# Patient Record
Sex: Female | Born: 1975 | Race: White | Hispanic: No | Marital: Married | Smoking: Current some day smoker
Health system: Southern US, Community
[De-identification: ages and names within clinical notes are randomized; demographics above are authoritative.]

## PROBLEM LIST (undated history)

## (undated) DIAGNOSIS — R42 Dizziness and giddiness: Secondary | ICD-10-CM

## (undated) DIAGNOSIS — R0789 Other chest pain: Secondary | ICD-10-CM

## (undated) DIAGNOSIS — R51 Headache: Secondary | ICD-10-CM

## (undated) DIAGNOSIS — R519 Headache, unspecified: Secondary | ICD-10-CM

## (undated) DIAGNOSIS — G8929 Other chronic pain: Secondary | ICD-10-CM

## (undated) DIAGNOSIS — F419 Anxiety disorder, unspecified: Secondary | ICD-10-CM

## (undated) HISTORY — PX: WISDOM TOOTH EXTRACTION: SHX21

## (undated) HISTORY — DX: Headache: R51

## (undated) HISTORY — PX: KNEE ARTHROSCOPY: SUR90

## (undated) HISTORY — DX: Headache, unspecified: R51.9

---

## 1998-02-20 ENCOUNTER — Emergency Department (HOSPITAL_COMMUNITY): Admission: EM | Admit: 1998-02-20 | Discharge: 1998-02-20 | Payer: Self-pay | Admitting: Emergency Medicine

## 2002-02-14 ENCOUNTER — Emergency Department (HOSPITAL_COMMUNITY): Admission: EM | Admit: 2002-02-14 | Discharge: 2002-02-14 | Payer: Self-pay | Admitting: Emergency Medicine

## 2002-05-12 ENCOUNTER — Emergency Department (HOSPITAL_COMMUNITY): Admission: EM | Admit: 2002-05-12 | Discharge: 2002-05-12 | Payer: Self-pay | Admitting: *Deleted

## 2011-02-02 ENCOUNTER — Encounter: Payer: Self-pay | Admitting: Emergency Medicine

## 2011-02-02 ENCOUNTER — Emergency Department (HOSPITAL_COMMUNITY)
Admission: EM | Admit: 2011-02-02 | Discharge: 2011-02-02 | Disposition: A | Discharge status: Home / Self Care | Payer: PRIVATE HEALTH INSURANCE | Attending: Emergency Medicine | Admitting: Emergency Medicine

## 2011-02-02 DIAGNOSIS — M436 Torticollis: Secondary | ICD-10-CM | POA: Insufficient documentation

## 2011-02-02 DIAGNOSIS — M62838 Other muscle spasm: Secondary | ICD-10-CM | POA: Insufficient documentation

## 2011-02-02 HISTORY — DX: Anxiety disorder, unspecified: F41.9

## 2011-02-02 MED ORDER — CYCLOBENZAPRINE HCL 5 MG PO TABS: 5.0000 mg | ORAL_TABLET | Freq: Three times a day (TID) | ORAL | Status: AC | PRN

## 2011-02-02 MED ORDER — IBUPROFEN 600 MG PO TABS: 600.0000 mg | ORAL_TABLET | Freq: Four times a day (QID) | ORAL | Status: AC | PRN

## 2011-02-02 MED ORDER — DIAZEPAM 5 MG PO TABS
5.0000 mg | ORAL_TABLET | Freq: Once | ORAL | Status: AC
  Administered 2011-02-02: 5 mg via ORAL
  Filled 2011-02-02: qty 1

## 2011-02-02 MED ORDER — HYDROCODONE-ACETAMINOPHEN 5-325 MG PO TABS
1.0000 | ORAL_TABLET | Freq: Once | ORAL | Status: AC
  Administered 2011-02-02: 1 via ORAL
  Filled 2011-02-02: qty 1

## 2011-02-02 MED ORDER — OXYCODONE-ACETAMINOPHEN 5-325 MG PO TABS: 1.0000 | ORAL_TABLET | ORAL | Status: AC | PRN

## 2011-02-02 MED ORDER — PREDNISONE 20 MG PO TABS
40.0000 mg | ORAL_TABLET | Freq: Once | ORAL | Status: AC
  Administered 2011-02-02: 40 mg via ORAL
  Filled 2011-02-02: qty 2

## 2011-02-02 NOTE — ED Provider Notes (Signed)
History     CSN: [id number] Arrival date & time: 02/02/2011  3:06 PM  Chief Complaint  Patient presents with  . Neck Pain  . Shoulder Pain    HPI Mariah [name] is a 35 y.o. female who presents to the ED for right side neck pain that started yesterday. The patient works 3rd shift and got home yesterday and went to sleep and woke with the pain. Last night at work pain got worse and has continued today. Pain increases with range of motion and any pressure on the area. She denies headache, fever or other symptoms.   Past Medical History  Diagnosis Date  . Anxiety     Past Surgical History  Procedure Date  . Knee arthroscopy     No family history on file.  History  Substance Use Topics  . Smoking status: Not on file  . Smokeless tobacco: Not on file  . Alcohol Use:     OB History    Grav Para Term Preterm Abortions TAB SAB Ect Mult Living                  Review of Systems  HENT: Positive for neck pain.   Psychiatric/Behavioral: The patient is nervous/anxious.   All other systems reviewed and are negative.    Allergies  Penicillins  Home Medications   Current Outpatient Rx  Name Route Sig Dispense Refill  . CITALOPRAM HYDROBROMIDE 20 MG PO TABS Oral Take 20 mg by mouth daily.      Marland Kitchen HEAT THERAPY PATCHES MISC Topical Apply 1 each topically as needed. For muscle pain     . MOMETASONE FUROATE 50 MCG/ACT NA SUSP Nasal Place 2 sprays into the nose daily.        BP 114/77  Pulse 62  Temp 98.6 F (37 C)  Resp 16  Ht 5' 8.5" (1.74 m)  Wt 123 lb (55.792 kg)  BMI 18.43 kg/m2  SpO2 100%  LMP 01/30/2011  Physical Exam  Nursing note and vitals reviewed. Constitutional: She is oriented to person, place, and time. She appears well-developed and well-nourished. No distress.       Uncomfortable appearing. Holding neck tilted to right.  HENT:  Head: Normocephalic and atraumatic.  Eyes: EOM are normal.  Neck: Neck supple.       Decreased range of motion    Cardiovascular: Normal rate.   Pulmonary/Chest: Effort normal.  Abdominal: Soft. There is no tenderness.  Musculoskeletal:       Muscle spasm right side of neck.  Neurological: She is alert and oriented to person, place, and time. She has normal strength. No cranial nerve deficit or sensory deficit.  Skin: Skin is warm and dry.    ED Course  Procedures  MDM  Patient feeling better after medication and heat to neck.  Assessment: Torticollis  Plan:  Flexeril 5 mg. Po tid x 5 days   Percocet 5/325 mg. Po every 6 hours prn   Ibuprofen 600 mg. Po every 6 hours prn   Heat and rest.   Return as needed.   [place], NP 02/02/11 1617  [place], [place] 02/03/11 [phone]

## 2011-02-02 NOTE — ED Notes (Signed)
Pt states woke up yesterday with right side neck pain and shoulder pain. Pt state sfell one week ago but didn't start hurting until today

## 2011-02-02 NOTE — ED Notes (Signed)
Pt states is unsure why she is having neck/shoulder pain.  Thought she slept " wrong" however the pain has worsened over past 24 hrs.  States pain in neck is like a spasm pain increases when moving neck to the left.

## 2011-02-12 NOTE — ED Provider Notes (Signed)
Medical screening examination/treatment/procedure(s) were performed by non-physician practitioner and as supervising physician I was immediately available for consultation/collaboration.   Shelda Jakes, MD 02/12/11 [phone]

## 2012-05-04 ENCOUNTER — Emergency Department (HOSPITAL_COMMUNITY): Payer: Self-pay

## 2012-05-04 ENCOUNTER — Encounter (HOSPITAL_COMMUNITY): Payer: Self-pay

## 2012-05-04 ENCOUNTER — Emergency Department (HOSPITAL_COMMUNITY)
Admission: EM | Admit: 2012-05-04 | Discharge: 2012-05-04 | Disposition: A | Discharge status: Home / Self Care | Payer: Self-pay | Attending: Emergency Medicine | Admitting: Emergency Medicine

## 2012-05-04 DIAGNOSIS — R42 Dizziness and giddiness: Secondary | ICD-10-CM

## 2012-05-04 DIAGNOSIS — H81399 Other peripheral vertigo, unspecified ear: Secondary | ICD-10-CM | POA: Insufficient documentation

## 2012-05-04 DIAGNOSIS — Z8659 Personal history of other mental and behavioral disorders: Secondary | ICD-10-CM | POA: Insufficient documentation

## 2012-05-04 DIAGNOSIS — R110 Nausea: Secondary | ICD-10-CM | POA: Insufficient documentation

## 2012-05-04 DIAGNOSIS — R5381 Other malaise: Secondary | ICD-10-CM | POA: Insufficient documentation

## 2012-05-04 DIAGNOSIS — F17200 Nicotine dependence, unspecified, uncomplicated: Secondary | ICD-10-CM | POA: Insufficient documentation

## 2012-05-04 DIAGNOSIS — R5383 Other fatigue: Secondary | ICD-10-CM | POA: Insufficient documentation

## 2012-05-04 LAB — COMPREHENSIVE METABOLIC PANEL
ALT: 11 U/L (ref 0–35)
AST: 18 U/L (ref 0–37)
Albumin: 4.3 g/dL (ref 3.5–5.2)
Alkaline Phosphatase: 45 U/L (ref 39–117)
CO2: 28 mEq/L (ref 19–32)
Chloride: 103 mEq/L (ref 96–112)
GFR calc non Af Amer: >90 mL/min (ref >90)
Potassium: 4 mEq/L (ref 3.5–5.1)
Sodium: 135 mEq/L (ref 135–145)
Total Bilirubin: 0.5 mg/dL (ref 0.3–1.2)

## 2012-05-04 LAB — CBC WITH DIFFERENTIAL/PLATELET
Basophils Absolute: 0.1 10*3/uL (ref 0.0–0.1)
Basophils Relative: 1 % (ref 0–1)
HCT: 38.7 % (ref 36.0–46.0)
Lymphocytes Relative: 14 % (ref 12–46)
MCHC: 33.3 g/dL (ref 30.0–36.0)
Monocytes Absolute: 0.6 10*3/uL (ref 0.1–1.0)
Neutro Abs: 5.8 10*3/uL (ref 1.7–7.7)
Neutrophils Relative %: 75 % (ref 43–77)
Platelets: 208 10*3/uL (ref 150–400)
RDW: 12.4 % (ref 11.5–15.5)
WBC: 7.7 10*3/uL (ref 4.0–10.5)

## 2012-05-04 LAB — URINALYSIS, ROUTINE W REFLEX MICROSCOPIC
Glucose, UA: NEGATIVE mg/dL
Leukocytes, UA: NEGATIVE
Nitrite: NEGATIVE
Protein, ur: NEGATIVE mg/dL

## 2012-05-04 MED ORDER — ONDANSETRON HCL 4 MG/2ML IJ SOLN
4.0000 mg | Freq: Once | INTRAMUSCULAR | Status: AC
  Administered 2012-05-04: 4 mg via INTRAVENOUS
  Filled 2012-05-04: qty 2

## 2012-05-04 MED ORDER — MECLIZINE HCL 25 MG PO TABS: ORAL_TABLET | ORAL | Status: DC

## 2012-05-04 MED ORDER — PROMETHAZINE HCL 25 MG PO TABS: ORAL_TABLET | ORAL | Status: DC

## 2012-05-04 MED ORDER — MECLIZINE HCL 12.5 MG PO TABS
25.0000 mg | ORAL_TABLET | Freq: Once | ORAL | Status: AC
Start: 2012-05-04 — End: 2012-05-04
  Administered 2012-05-04: 25 mg via ORAL
  Filled 2012-05-04: qty 2

## 2012-05-04 MED ORDER — SODIUM CHLORIDE 0.9 % IV BOLUS (SEPSIS)
1000.0000 mL | Freq: Once | INTRAVENOUS | Status: AC
  Administered 2012-05-04: 1000 mL via INTRAVENOUS

## 2012-05-04 NOTE — ED Provider Notes (Signed)
History    Scribed for Mariah Lennert, MD, the patient was seen in room APA14/APA14. This chart was scribed by Katha Cabal.  CSN: [id number]  Arrival date & time 05/04/12  1248   First MD Initiated Contact with Patient 05/04/12 1407      No chief complaint on file.   (Consider location/radiation/quality/duration/timing/severity/associated sxs/prior treatment) Patient is a 37 y.o. female presenting with weakness. The history is provided by the patient (the pt complains of dizziness with movement). No language interpreter was used.  Weakness Primary symptoms do not include seizures. The symptoms began less than 1 hour ago. The symptoms are unchanged. The neurological symptoms are multifocal. Context: nothing.  Additional symptoms include weakness.   Mariah Lennert, MD entered patient's room at 2:13 PM   Mariah [name] is a 37 y.o. female who presents to the Emergency Department complaining of persistence of constant dizziness that began around 3 AM.  Dizziness with associated nausea.  Dizziness is worse with walking.  Symptoms are not associated with cough, fever, rhinorrhea or abdominal pain.  Reports itchy throat for past couple days and took Alka Seltzer cold medication.      PCP No primary provider on file.      Past Medical History  Diagnosis Date  . Anxiety     Past Surgical History  Procedure Date  . Knee arthroscopy     No family history on file.  History  Substance Use Topics  . Smoking status: Current Every Day Smoker  . Smokeless tobacco: Not on file  . Alcohol Use: Yes     Comment: occ    OB History    Grav Para Term Preterm Abortions TAB SAB Ect Mult Living                  Review of Systems  Neurological: Positive for weakness. Negative for seizures.  All other systems reviewed and are negative.  Remaining review of systems negative except as noted in the HPI.    Allergies  Penicillins  Home Medications   No current outpatient  prescriptions on file.  BP 122/74  Pulse 81  Temp 97.4 F (36.3 C) (Oral)  Resp 20  Ht 5\' 8"  (1.727 m)  Wt 125 lb (56.7 kg)  BMI 19.01 kg/m2  SpO2 99%  LMP 04/20/2012  Physical Exam  Constitutional: She is oriented to person, place, and time. She appears well-developed.  HENT:  Head: Normocephalic and atraumatic.  Right Ear: Tympanic membrane normal.  Left Ear: Tympanic membrane normal.  Eyes: Conjunctivae normal and EOM are normal. No scleral icterus.  Neck: Neck supple. No thyromegaly present.  Cardiovascular: Normal rate and regular rhythm.  Exam reveals no gallop and no friction rub.   No murmur heard. Pulmonary/Chest: No stridor. She has no wheezes. She has no rales. She exhibits no tenderness.  Abdominal: She exhibits no distension. There is no tenderness. There is no rebound.  Musculoskeletal: Normal range of motion. She exhibits no edema.  Lymphadenopathy:    She has no cervical adenopathy.  Neurological: She is oriented to person, place, and time. Coordination normal.  Skin: No rash noted. No erythema.  Psychiatric: She has a normal mood and affect. Her behavior is normal.    ED Course  Procedures (including critical care time)    DIAGNOSTIC STUDIES: Oxygen Saturation is 99% on room air normal by my interpretation.     COORDINATION OF CARE:  2:16 PM  Physical exam complete.  Will CT head and  order vertigo meds.    2:30 PM Antivert, Zofran and IV fluids ordered.   3:54 PM Patient reports some improvement of nausea and dizziness with treatment.  Patient last vomited an hour ago.   Discussed laboratory findings with patient.  Plan to discharge patient with vertigo meds and antiemetic.  Advised patient to increase fluid intake and get plenty of rest.  Will provide patient with work note.   Patient agrees with plan.      LABS / RADIOLOGY:   Labs Reviewed  URINALYSIS, ROUTINE W REFLEX MICROSCOPIC - Abnormal; Notable for the following:    APPearance CLOUDY (*)      pH 8.5 (*)     All other components within normal limits  CBC WITH DIFFERENTIAL  COMPREHENSIVE METABOLIC PANEL  POCT PREGNANCY, URINE   Ct Head Wo Contrast  05/04/2012  *RADIOLOGY REPORT*  Clinical Data: Dizziness  CT HEAD WITHOUT CONTRAST  Technique:  Contiguous axial images were obtained from the base of the skull through the vertex without contrast.  Comparison: 07/13/2010  Findings: The brain has a normal appearance without evidence for hemorrhage, infarction, hydrocephalus, or mass lesion.  There is no extra axial fluid collection.  The skull and paranasal sinuses are normal.  IMPRESSION:  1.  No acute intracranial abnormalities.   Original Report Authenticated By: Signa Kell, M.D.          IMPRESSION: 1. Vertigo      NEW MEDICATIONS: New Prescriptions   No medications on file            MDM          The chart was scribed for me under my direct supervision.  I personally performed the history, physical, and medical decision making and all procedures in the evaluation of this patient.Mariah Lennert, MD 05/04/12 [phone]

## 2012-05-04 NOTE — ED Notes (Signed)
Instructed pt to notify staff when she can provide urine sample.

## 2012-05-04 NOTE — ED Notes (Signed)
Patient transported to CT 

## 2012-05-04 NOTE — ED Notes (Signed)
Pt reports for past 2 or 3 days has had scratchy cough and headache.  Has been taking alkaseltzer cold medicine.  Last dose was 1230 last night.  Reports woke up around 0300 with dizziness, n/v.  Pt says dizziness is much worse with movement and makes her vomit.  Denies any pain.

## 2012-09-01 ENCOUNTER — Encounter (HOSPITAL_COMMUNITY): Payer: Self-pay

## 2012-09-01 ENCOUNTER — Emergency Department (HOSPITAL_COMMUNITY)
Admission: EM | Admit: 2012-09-01 | Discharge: 2012-09-01 | Disposition: A | Discharge status: Home / Self Care | Payer: Self-pay | Attending: Emergency Medicine | Admitting: Emergency Medicine

## 2012-09-01 ENCOUNTER — Emergency Department (HOSPITAL_COMMUNITY): Payer: Self-pay

## 2012-09-01 DIAGNOSIS — Y9289 Other specified places as the place of occurrence of the external cause: Secondary | ICD-10-CM | POA: Insufficient documentation

## 2012-09-01 DIAGNOSIS — Z9889 Other specified postprocedural states: Secondary | ICD-10-CM | POA: Insufficient documentation

## 2012-09-01 DIAGNOSIS — S86911A Strain of unspecified muscle(s) and tendon(s) at lower leg level, right leg, initial encounter: Secondary | ICD-10-CM

## 2012-09-01 DIAGNOSIS — F17200 Nicotine dependence, unspecified, uncomplicated: Secondary | ICD-10-CM | POA: Insufficient documentation

## 2012-09-01 DIAGNOSIS — Y990 Civilian activity done for income or pay: Secondary | ICD-10-CM | POA: Insufficient documentation

## 2012-09-01 DIAGNOSIS — Z880 Allergy status to penicillin: Secondary | ICD-10-CM | POA: Insufficient documentation

## 2012-09-01 DIAGNOSIS — IMO0002 Reserved for concepts with insufficient information to code with codable children: Secondary | ICD-10-CM | POA: Insufficient documentation

## 2012-09-01 DIAGNOSIS — Y9301 Activity, walking, marching and hiking: Secondary | ICD-10-CM | POA: Insufficient documentation

## 2012-09-01 DIAGNOSIS — X503XXA Overexertion from repetitive movements, initial encounter: Secondary | ICD-10-CM | POA: Insufficient documentation

## 2012-09-01 DIAGNOSIS — Z8659 Personal history of other mental and behavioral disorders: Secondary | ICD-10-CM | POA: Insufficient documentation

## 2012-09-01 MED ORDER — HYDROCODONE-ACETAMINOPHEN 5-325 MG PO TABS: 2.0000 | ORAL_TABLET | Freq: Four times a day (QID) | ORAL | Status: DC | PRN

## 2012-09-01 NOTE — ED Notes (Signed)
Onset of pain right shin after being on feet 12 hours, no known injury

## 2012-09-01 NOTE — ED Provider Notes (Signed)
History  This chart was scribed for Mariah Lyons, MD by Shari Heritage, ED Scribe. The patient was seen in room APA17/APA17. Patient's care was started at 0703.   CSN: [id number]  Arrival date & time 09/01/12  4098   First MD Initiated Contact with Patient 09/01/12 0703      Chief Complaint  Patient presents with  . Leg Pain    The history is provided by the patient. No language interpreter was used.    HPI Comments: Mariah [name] is a 37 y.o. female who presents to the Emergency Department complaining of gradual, worsening, moderate right anterior lower leg pain onset yesterday night. She states that pain began after arriving at work last night where she is required to stand for long periods of time and do a lot of walking. Pain is worse with walking and flexion of the right foot. She denies any obvious injury or trauma to the area. She denies any right calf pain. She has no history of the same. There is no numbness, weakness, tingling, nausea, vomiting, fever or chills. Patient has a medical history of anxiety. She is a current every day smoker and uses alcohol occasionally.   Past Medical History  Diagnosis Date  . Anxiety     Past Surgical History  Procedure Laterality Date  . Knee arthroscopy      History reviewed. No pertinent family history.  History  Substance Use Topics  . Smoking status: Current Every Day Smoker  . Smokeless tobacco: Not on file  . Alcohol Use: Yes     Comment: occ    OB History   Grav Para Term Preterm Abortions TAB SAB Ect Mult Living                  Review of Systems  Constitutional: Negative for fever and chills.  Gastrointestinal: Negative for nausea and vomiting.  Neurological: Negative for weakness and numbness.    Allergies  Penicillins  Home Medications   Current Outpatient Rx  Name  Route  Sig  Dispense  Refill  . meclizine (ANTIVERT) 25 MG tablet      Take one every 4-6 hours for dizziness   28 tablet   0   .  promethazine (PHENERGAN) 25 MG tablet      Take one every 4-6 hours for nauseau   20 tablet   0     Triage Vitals: BP 115/71  Pulse 66  Temp(Src) 98.2 F (36.8 C) (Oral)  Resp 16  SpO2 100%  LMP 07/31/2012  Physical Exam  Constitutional: She is oriented to person, place, and time. She appears well-developed and well-nourished.  HENT:  Head: Normocephalic and atraumatic.  Eyes: Conjunctivae and EOM are normal. Pupils are equal, round, and reactive to light.  Neck: Normal range of motion. Neck supple.  Pulmonary/Chest: No respiratory distress.  Musculoskeletal: Normal range of motion.  Right leg appears grossly normal. Tenderness to palpation on the lower anterior right shin area. DP and PT pulses are easily palpable. Pain in the anterior leg with dorsiflexion. Homans sign is absent.  Neurological: She is alert and oriented to person, place, and time.  Skin: Skin is warm and dry.  Psychiatric: She has a normal mood and affect. Her behavior is normal.    ED Course  Procedures (including critical care time) DIAGNOSTIC STUDIES: Oxygen Saturation is 100% on room air, normal by my interpretation.    COORDINATION OF CARE: 7:22 AM- Patient informed of current plan for treatment and  evaluation and agrees with plan at this time.      Labs Reviewed - No data to display No results found.   No diagnosis found.    MDM  The xrays are unremarkable.  This appears to be a strain of the tendons of the anterior tibial area.  There is no calf pain, tenderness and I have no suspicion for DVT.  Will treat with nsaids, pain meds.  Follow up prn.      I personally performed the services described in this documentation, which was scribed in my presence. The recorded information has been reviewed and is accurate.      Mariah Lyons, MD 09/01/12 [phone]

## 2014-03-23 ENCOUNTER — Encounter (HOSPITAL_COMMUNITY): Payer: Self-pay | Admitting: *Deleted

## 2014-03-23 ENCOUNTER — Emergency Department (HOSPITAL_COMMUNITY)
Admission: EM | Admit: 2014-03-23 | Discharge: 2014-03-23 | Disposition: A | Discharge status: Home / Self Care | Payer: PRIVATE HEALTH INSURANCE | Attending: Emergency Medicine | Admitting: Emergency Medicine

## 2014-03-23 DIAGNOSIS — Z8659 Personal history of other mental and behavioral disorders: Secondary | ICD-10-CM | POA: Insufficient documentation

## 2014-03-23 DIAGNOSIS — Z79899 Other long term (current) drug therapy: Secondary | ICD-10-CM | POA: Insufficient documentation

## 2014-03-23 DIAGNOSIS — M940 Chondrocostal junction syndrome [Tietze]: Secondary | ICD-10-CM

## 2014-03-23 MED ORDER — HYDROCODONE-ACETAMINOPHEN 5-325 MG PO TABS: ORAL_TABLET | ORAL | Status: DC

## 2014-03-23 MED ORDER — NAPROXEN 500 MG PO TABS: 500.0000 mg | ORAL_TABLET | Freq: Two times a day (BID) | ORAL | Status: DC

## 2014-03-23 NOTE — ED Provider Notes (Signed)
CSN: [id number]     Arrival date & time 03/23/14  1810 History  This chart was scribed for Mariah Ausammy Farha Dano, PA-C with Gerhard Munchobert Lockwood, MD by Tonye RoyaltyJoshua Chen, ED Scribe. This patient was seen in room APFT20/APFT20 and the patient's care was started at 7:55 PM.    Chief Complaint  Patient presents with  . Muscle Pain   The history is provided by the patient. No language interpreter was used.    HPI Comments: Mariah [name] is a 38 y.o. female who presents to the Emergency Department complaining of right-sided chest pain with gradual onset since 2 days ago; she states she believes she pulled a muscle. 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 She notes that felt a "pop" on waking and rolling over 4 days ago, but did not have pain until 2 days ago. She states pain is worse with ROM of her right arm, when she sits straight up, and once with deep breath this morning. She states it is painful to lay on either side. She states she has used a heating pad, Tylenol, and Motrin. She denies tenderness to the touch and states it feels deeper than her breast. She reports recent benign breast exam. She denies prior rib fractures. She denies shortness of breath, numbness/tingling in her hands, dizziness, nausea, neck pain or diaphoresis. She denies any cracking or popping sensations with breathing or movement.  Past Medical History  Diagnosis Date  . Anxiety    Past Surgical History  Procedure Laterality Date  . Knee arthroscopy     No family history on file. History  Substance Use Topics  . Smoking status: Current Every Day Smoker  . Smokeless tobacco: Not on file  . Alcohol Use: Yes     Comment: occ   OB History    No data available     Review of Systems  Constitutional: Negative for diaphoresis.  Respiratory: Negative for cough, chest tightness and shortness of breath.   Cardiovascular: Positive for chest pain. Negative for  palpitations.  Gastrointestinal: Negative for nausea.  Musculoskeletal: Negative for neck pain and neck stiffness.  Skin: Negative for rash and wound.  Neurological: Negative for dizziness, weakness, numbness and headaches.  All other systems reviewed and are negative.     Allergies  Penicillins  Home Medications   Prior to Admission medications   Medication Sig Start Date End Date Taking? Authorizing Provider  cetirizine (ZYRTEC) 10 MG tablet Take 10 mg by mouth daily.   Yes Historical Provider, MD  meclizine (ANTIVERT) 25 MG tablet Take one every 4-6 hours for dizziness 05/04/12  Yes Benny LennertJoseph L Zammit, MD  Multiple Vitamin (MULTIVITAMIN WITH MINERALS) TABS tablet Take 1 tablet by mouth daily.   Yes Historical Provider, MD  vitamin C (ASCORBIC ACID) 500 MG tablet Take 500 mg by mouth daily.   Yes Historical Provider, MD  HYDROcodone-acetaminophen (NORCO/VICODIN) 5-325 MG per tablet Take 2 tablets by mouth every 6 (six) hours as needed for pain. Patient not taking: Reported on 03/23/2014 09/01/12   Geoffery Lyonsouglas Delo, MD  promethazine (PHENERGAN) 25 MG tablet Take one every 4-6 hours for nauseau Patient not taking: Reported on 03/23/2014 05/04/12   Benny LennertJoseph L Zammit, MD   BP 119/69 mmHg  Pulse 68  Temp(Src) 98.1 F (36.7 C) (Oral)  Resp 16  Ht 5\' 9"  (1.753 m)  Wt 145 lb (65.772 kg)  BMI 21.40 kg/m2  SpO2  100%  LMP 03/11/2014 Physical Exam  Constitutional: She is oriented to person, place, and time. She appears well-developed and well-nourished.  HENT:  Head: Normocephalic and atraumatic.  Eyes: Conjunctivae are normal.  Neck: Normal range of motion, full passive range of motion without pain and phonation normal. Neck supple. No Kernig's sign noted.  Cardiovascular: Normal rate, regular rhythm, normal heart sounds and intact distal pulses.   No murmur heard. Pulmonary/Chest: Effort normal and breath sounds normal. No respiratory distress. She has no wheezes. She has no rales.  Tenderness: localized tendernes to right substernal area, pain is reproduceable with right arm movement, no edema or crepitus.  Musculoskeletal: Normal range of motion. She exhibits no edema.  Neurological: She is alert and oriented to person, place, and time. She exhibits normal muscle tone. Coordination normal.  Skin: Skin is warm and dry.  Psychiatric: She has a normal mood and affect.  Nursing note and vitals reviewed.   ED Course  Procedures (including critical care time)  DIAGNOSTIC STUDIES: Oxygen Saturation is 100% on room air, normal by my interpretation.    COORDINATION OF CARE: 8:02 PM Discussed treatment plan with patient at beside, the patient agrees with the plan and has no further questions at this time.   Labs Review Labs Reviewed - No data to display  Imaging Review No results found.   EKG Interpretation None      MDM   Final diagnoses:  Costochondritis    VSS.  Pain to right mid chest is reproduced with right arm movement and position change.  No tachycardia, tachypnea or hypoxia.  Patient declined to have XR's at this time.  No concerning sx's for PE or cardiac process at this time. She agrees to PMD f/u or to return here if sx's worsen.  rx for vicodin and naprosyn, agrees to heat and ice  I personally performed the services described in this documentation, which was scribed in my presence. The recorded information has been reviewed and is accurate.    Shreyansh Tiffany L. Trisha Mangleriplett, PA-C 03/25/14 1309  Gerhard Munchobert Lockwood, MD 03/25/14 [phone]

## 2014-03-23 NOTE — ED Notes (Signed)
Pain rt chest at sternal border, No known injury, Movement of arm causes pain at times.

## 2014-03-23 NOTE — ED Notes (Signed)
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 Pain is worse with movement.

## 2014-03-23 NOTE — Discharge Instructions (Signed)
Costochondritis °Costochondritis is a condition in which the tissue (cartilage) that connects your ribs with your breastbone (sternum) becomes irritated. It causes pain in the chest and rib area. It usually goes away on its own over time. °HOME CARE °· Avoid activities that wear you out. °· Do not strain your ribs. Avoid activities that use your: °¨ Chest. °¨ Belly. °¨ Side muscles. °· Put ice on the area for the first 2 days after the pain starts. °¨ Put ice in a plastic bag. °¨ Place a towel between your skin and the bag. °¨ Leave the ice on for 20 minutes, 2-3 times a day. °· Only take medicine as told by your doctor. °GET HELP IF: °· You have redness or puffiness (swelling) in the rib area. °· Your pain does not go away with rest or medicine. °GET HELP RIGHT AWAY IF:  °· Your pain gets worse. °· You are very uncomfortable. °· You have trouble breathing. °· You cough up blood. °· You start sweating or throwing up (vomiting). °· You have a fever or lasting symptoms for more than 2-3 days. °· You have a fever and your symptoms suddenly get worse. °MAKE SURE YOU:  °· Understand these instructions. °· Will watch your condition. °· Will get help right away if you are not doing well or get worse. °Document Released: 09/27/2007 Document Revised: 12/11/2012 Document Reviewed: 11/12/2012 °[employer]® Patient Information ©2015 [employer]. This information is not intended to replace advice given to you by your health care provider. Make sure you discuss any questions you have with your health care provider. ° °

## 2014-06-10 ENCOUNTER — Encounter (HOSPITAL_COMMUNITY): Payer: Self-pay | Admitting: Emergency Medicine

## 2014-06-10 ENCOUNTER — Emergency Department (HOSPITAL_COMMUNITY)
Admission: EM | Admit: 2014-06-10 | Discharge: 2014-06-10 | Disposition: A | Discharge status: Home / Self Care | Payer: PRIVATE HEALTH INSURANCE | Attending: Emergency Medicine | Admitting: Emergency Medicine

## 2014-06-10 ENCOUNTER — Emergency Department (HOSPITAL_COMMUNITY): Payer: PRIVATE HEALTH INSURANCE

## 2014-06-10 DIAGNOSIS — Z880 Allergy status to penicillin: Secondary | ICD-10-CM | POA: Insufficient documentation

## 2014-06-10 DIAGNOSIS — Z791 Long term (current) use of non-steroidal anti-inflammatories (NSAID): Secondary | ICD-10-CM | POA: Insufficient documentation

## 2014-06-10 DIAGNOSIS — G8929 Other chronic pain: Secondary | ICD-10-CM | POA: Diagnosis not present

## 2014-06-10 DIAGNOSIS — R0789 Other chest pain: Secondary | ICD-10-CM | POA: Insufficient documentation

## 2014-06-10 DIAGNOSIS — R079 Chest pain, unspecified: Secondary | ICD-10-CM | POA: Diagnosis present

## 2014-06-10 DIAGNOSIS — Z79899 Other long term (current) drug therapy: Secondary | ICD-10-CM | POA: Diagnosis not present

## 2014-06-10 DIAGNOSIS — Z87891 Personal history of nicotine dependence: Secondary | ICD-10-CM | POA: Diagnosis not present

## 2014-06-10 DIAGNOSIS — Z8659 Personal history of other mental and behavioral disorders: Secondary | ICD-10-CM | POA: Diagnosis not present

## 2014-06-10 HISTORY — DX: Other chronic pain: G89.29

## 2014-06-10 HISTORY — DX: Dizziness and giddiness: R42

## 2014-06-10 HISTORY — DX: Other chest pain: R07.89

## 2014-06-10 LAB — BASIC METABOLIC PANEL
ANION GAP: 4 — AB (ref 5–15)
BUN: 13 mg/dL (ref 6–23)
CHLORIDE: 109 mmol/L (ref 96–112)
CO2: 27 mmol/L (ref 19–32)
Calcium: 9.3 mg/dL (ref 8.4–10.5)
Creatinine, Ser: 0.84 mg/dL (ref 0.50–1.10)
GFR calc non Af Amer: 86 mL/min — ABNORMAL LOW (ref >90)
Glucose, Bld: 82 mg/dL (ref 70–99)
POTASSIUM: 4 mmol/L (ref 3.5–5.1)
Sodium: 140 mmol/L (ref 135–145)

## 2014-06-10 LAB — CBC
HEMATOCRIT: 36.1 % (ref 36.0–46.0)
HEMOGLOBIN: 12.1 g/dL (ref 12.0–15.0)
MCH: 30.4 pg (ref 26.0–34.0)
MCHC: 33.5 g/dL (ref 30.0–36.0)
MCV: 90.7 fL (ref 78.0–100.0)
Platelets: 243 10*3/uL (ref 150–400)
RBC: 3.98 MIL/uL (ref 3.87–5.11)
RDW: 12.8 % (ref 11.5–15.5)
WBC: 5.8 10*3/uL (ref 4.0–10.5)

## 2014-06-10 LAB — TROPONIN I

## 2014-06-10 MED ORDER — CYCLOBENZAPRINE HCL 10 MG PO TABS
10.0000 mg | ORAL_TABLET | Freq: Once | ORAL | Status: AC
  Administered 2014-06-10: 10 mg via ORAL
  Filled 2014-06-10: qty 1

## 2014-06-10 MED ORDER — OXYCODONE-ACETAMINOPHEN 5-325 MG PO TABS: ORAL_TABLET | ORAL | Status: DC

## 2014-06-10 MED ORDER — OXYCODONE-ACETAMINOPHEN 5-325 MG PO TABS
2.0000 | ORAL_TABLET | Freq: Once | ORAL | Status: AC
  Administered 2014-06-10: 2 via ORAL
  Filled 2014-06-10: qty 2

## 2014-06-10 MED ORDER — NAPROXEN 250 MG PO TABS: 250.0000 mg | ORAL_TABLET | Freq: Two times a day (BID) | ORAL | Status: DC | PRN

## 2014-06-10 MED ORDER — METHOCARBAMOL 500 MG PO TABS: 1000.0000 mg | ORAL_TABLET | Freq: Four times a day (QID) | ORAL | Status: DC | PRN

## 2014-06-10 NOTE — ED Notes (Signed)
Patient complaining of right sided chest pain starting approximately 20 minutes ago at work. States "I have had a swollen cartilage in my sternum before with the same pain. 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

## 2014-06-10 NOTE — ED Provider Notes (Signed)
CSN: [id number]     Arrival date & time 06/10/14  1405 History   First MD Initiated Contact with Patient 06/10/14 1651     Chief Complaint  Patient presents with  . Chest Pain      HPI Pt was seen at 1700. Per pt, c/o gradual onset and persistence of constant acute flair of her constant right sided chest wall "pain" for the past 6 months, worse over the past day. Pt states she has hx of this same discomfort 6 months ago, dx costochondritis.  Pt states she "never really got better." States she "lifts a lot of heavy stuff at work." Denies palpitations, no SOB/cough, no abd pain, no N/V/D, no back pain, no rash, no fevers.     Past Medical History  Diagnosis Date  . Anxiety   . Vertigo   . Chronic chest wall pain    Past Surgical History  Procedure Laterality Date  . Knee arthroscopy      History  Substance Use Topics  . Smoking status: Former Smoker    Types: Cigarettes    Quit date: 05/27/2014  . Smokeless tobacco: Not on file  . Alcohol Use: Yes     Comment: occ    Review of Systems ROS: Statement: All systems negative except as marked or noted in the HPI; Constitutional: Negative for fever and chills. ; ; Eyes: Negative for eye pain, redness and discharge. ; ; ENMT: Negative for ear pain, hoarseness, nasal congestion, sinus pressure and sore throat. ; ; Cardiovascular: Negative for palpitations, diaphoresis, dyspnea and peripheral edema. ; ; Respiratory: Negative for cough, wheezing and stridor. ; ; Gastrointestinal: Negative for nausea, vomiting, diarrhea, abdominal pain, blood in stool, hematemesis, jaundice and rectal bleeding. . ; ; Genitourinary: Negative for dysuria, flank pain and hematuria. ; ; Musculoskeletal: +chest wall pain. Negative for back pain and neck pain. Negative for swelling and trauma.; ; Skin: Negative for pruritus, rash, abrasions, blisters, bruising and skin lesion.; ; Neuro: Negative for headache, lightheadedness and neck stiffness. Negative for  weakness, altered level of consciousness , altered mental status, extremity weakness, paresthesias, involuntary movement, seizure and syncope.      Allergies  Penicillins  Home Medications   Prior to Admission medications   Medication Sig Start Date End Date Taking? Authorizing Provider  cetirizine (ZYRTEC) 10 MG tablet Take 10 mg by mouth daily.    Historical Provider, MD  HYDROcodone-acetaminophen (NORCO/VICODIN) 5-325 MG per tablet Take one-two tabs po q 4-6 hrs prn pain 03/23/14   Tammy L. Triplett, PA-C  meclizine (ANTIVERT) 25 MG tablet Take one every 4-6 hours for dizziness 05/04/12   Benny LennertJoseph L Zammit, MD  Multiple Vitamin (MULTIVITAMIN WITH MINERALS) TABS tablet Take 1 tablet by mouth daily.    Historical Provider, MD  naproxen (NAPROSYN) 500 MG tablet Take 1 tablet (500 mg total) by mouth 2 (two) times daily with a meal. 03/23/14   Tammy L. Triplett, PA-C  vitamin C (ASCORBIC ACID) 500 MG tablet Take 500 mg by mouth daily.    Historical Provider, MD   BP 119/81 mmHg  Pulse 84  Temp(Src) 97.9 F (36.6 C) (Oral)  Resp 16  Ht 5\' 9"  (1.753 m)  Wt 144 lb (65.318 kg)  BMI 21.26 kg/m2  SpO2 100%  LMP 06/05/2014 Physical Exam  1705: Physical examination:  Nursing notes reviewed; Vital signs and O2 SAT reviewed;  Constitutional: Well developed, Well nourished, Well hydrated, In no acute distress; Head:  Normocephalic, atraumatic; Eyes: EOMI, PERRL, No  scleral icterus; ENMT: Mouth and pharynx normal, Mucous membranes moist; Neck: Supple, Full range of motion, No lymphadenopathy; Cardiovascular: Regular rate and rhythm, No murmur, rub, or gallop; Respiratory: Breath sounds clear & equal bilaterally, No rales, rhonchi, wheezes.  Speaking full sentences with ease, Normal respiratory effort/excursion; Chest: +right anterior-lateral chest wall and parasternal areas tender to palp. No deformity, no rash, no soft tissue crepitus. Movement normal; Abdomen: Soft, Nontender, Nondistended, Normal  bowel sounds; Genitourinary: No CVA tenderness; Extremities: Pulses normal, No tenderness, No edema, No calf edema or asymmetry.; Neuro: AA&Ox3, Major CN grossly intact.  Speech clear. No gross focal motor or sensory deficits in extremities.; Skin: Color normal, Warm, Dry.    ED Course  Procedures     EKG Interpretation   Date/Time:  Wednesday June 10 2014 14:17:11 EST Ventricular Rate:  71 PR Interval:  142 QRS Duration: 80 QT Interval:  372 QTC Calculation: 404 R Axis:   84 Text Interpretation:  Normal sinus rhythm Normal ECG No old tracing to  compare Confirmed by [facility]  MD, Nicholos Johns [phone]) on 06/10/2014 5:03:46  PM      MDM  MDM Reviewed: previous chart, nursing note and vitals Reviewed previous: labs Interpretation: labs, ECG and x-ray   Results for orders placed or performed during the hospital encounter of 06/10/14  CBC  Result Value Ref Range   WBC 5.8 4.0 - 10.5 K/uL   RBC 3.98 3.87 - 5.11 MIL/uL   Hemoglobin 12.1 12.0 - 15.0 g/dL   HCT 60.4 54.0 - 98.1 %   MCV 90.7 78.0 - 100.0 fL   MCH 30.4 26.0 - 34.0 pg   MCHC 33.5 30.0 - 36.0 g/dL   RDW 19.1 47.8 - 29.5 %   Platelets 243 150 - 400 K/uL  Basic metabolic panel  Result Value Ref Range   Sodium 140 135 - 145 mmol/L   Potassium 4.0 3.5 - 5.1 mmol/L   Chloride 109 96 - 112 mmol/L   CO2 27 19 - 32 mmol/L   Glucose, Bld 82 70 - 99 mg/dL   BUN 13 6 - 23 mg/dL   Creatinine, Ser 6.21 0.50 - 1.10 mg/dL   Calcium 9.3 8.4 - 30.8 mg/dL   GFR calc non Af Amer 86 (L) >90 mL/min   GFR calc Af Amer >90 >90 mL/min   Anion gap 4 (L) 5 - 15  Troponin I (MHP)  Result Value Ref Range   Troponin I <0.03 <0.031 ng/mL   Dg Chest 2 View 06/10/2014   CLINICAL DATA:  RIGHT ANT CHEST/BREAST PAIN/DISCOMFORT OFF AND ON X 6 MONTHS, WORSE OVER PAST 30 MINS, FORMER SMOKER OF LESS THAN 1/4TH PK/DAY X 2 WEEKS.  EXAM: CHEST  2 VIEW  COMPARISON:  None.  FINDINGS: The heart size and mediastinal contours are within normal  limits. Both lungs are clear. The visualized skeletal structures are unremarkable.  IMPRESSION: No active cardiopulmonary disease.   Electronically Signed   By: Elberta Fortis M.D.   On: 06/10/2014 14:39    1715:  Doubt PE as cause for symptoms with low risk [place]. Doubt ACS as cause for symptoms with atypical symptoms over the past 6 months in low risk pt. Tx symptomatically. Dx and testing d/w pt and family.  Questions answered.  Verb understanding, agreeable to d/c home with outpt f/u.    Samuel Jester, DO 06/13/14 [phone]

## 2014-06-10 NOTE — Discharge Instructions (Signed)
°Emergency Department Resource Guide °1) Find a Doctor and Pay Out of Pocket °Although you won't have to find out who is covered by your insurance plan, it is a good idea to ask around and get recommendations. You will then need to call the office and see if the doctor you have chosen will accept you as a new patient and what types of options they offer for patients who are self-pay. Some doctors offer discounts or will set up payment plans for their patients who do not have insurance, but you will need to ask so you aren't surprised when you get to your appointment. ° °2) Contact Your Local Health Department °Not all health departments have doctors that can see patients for sick visits, but many do, so it is worth a call to see if yours does. If you don't know where your local health department is, you can check in your phone book. The CDC also has a tool to help you locate your state's health department, and many state websites also have listings of all of their local health departments. ° °3) Find a Walk-in Clinic °If your illness is not likely to be very severe or complicated, you may want to try a walk in clinic. These are popping up all over the country in pharmacies, drugstores, and shopping centers. They're usually staffed by nurse practitioners or physician assistants that have been trained to treat common illnesses and complaints. They're usually fairly quick and inexpensive. However, if you have serious medical issues or chronic medical problems, these are probably not your best option. ° °No Primary Care Doctor: °- Call [facility] at  832-8000 - they can help you locate a primary care doctor that  accepts your insurance, provides certain services, etc. °- Physician Referral Service- 1-800-533-3463 ° °Chronic Pain Problems: °Organization         Address  Phone   Notes  °[facility]  (336) 297-2271 Patients need to be referred by their primary care doctor.  ° °Medication  Assistance: °Organization         Address  Phone   Notes  °[place] [facility] [address] °[place], [place] [address] (336) 641-8030 --Must be a resident of [place] °-- Must have NO insurance coverage whatsoever (no Medicaid/ Medicare, etc.) °-- The pt. MUST have a primary care doctor that directs their care regularly and follows them in the community °  °[facility]  (866) 331-1348   °[employer]  (888) 892-1162   ° °Agencies that provide inexpensive medical care: °Organization         Address  Phone   Notes  °[facility]  (336) 832-8035   °[facility]    (336) 832-7272   °[facility] [address] °[place], [place] [address] (336) 832-4777   °[facility] [address], °[place] (336) 271-4999   °[facility]    (336) 373-0678   °[facility]    (336) 272-1050   °[facility] ° [address], [place] Phone:  (336) 832-4444, Fax:  (336) 832-4440 Hours of Operation:  9 am - 6 pm, M-F.  Also accepts Medicaid/Medicare and self-pay.  °[facility] ° [address], [place] Phone: (336) 832-3150, Fax: (336) 832-3151. Hours of Operation:  8:30 am - 5:30 pm, M-F.  Also accepts Medicaid and self-pay.  °[facility] [place] [address], [place] Phone: (336) 878-6027   °[facility] [address], [place], [place] (336)723-1848, Ext. 123 Mondays & Thursdays: 7-9 AM.  First 15 patients are seen on a first come, first serve basis. °  ° °Medicaid-accepting [place] Providers: ° °Organization         Address  Phone   Notes  °[facility] [address], [place] (336) 641-2100 Also accepts self-pay patients.  °[facility] [address], [place] ° (336) 856-9996   °[facility] [address], [place]  (336) 288-8857   °[facility] [address] [place] [address], [place] (336) 299-7000   °Veita Bland [address], [place]  ° (336) 373-1557 Only accepts [place] Access Medicaid patients after they have their name applied to their card.  ° °Self-Pay (no insurance) in [place]: ° °Organization         Address  Phone   Notes  °Sickle Cell Patients, [facility] [address], [place] (336) 832-1970   °[facility] [address], [place] (336) 832-4400   °[facility] [place] ° [address], [place] (336) 992-4800   °[facility]/Dr. Osei-Bonsu ° [address] [place] [address], [place] or [address], [place] (336) 841-8500 Phone number for both [place] and [place] locations is the same.  °[facility] [address], [place] (336) 299-0000   °[facility] [address] [place] [address], [place] or [address] (336) 852-7530 °(336) 878-2260   °[facility] [address], [place] (336) 350-1642, phone; (336) 294-5005, fax Sees patients 1st and 3rd Saturday of every month.  Must not qualify for public or private insurance (i.e. Medicaid, Medicare, [place] Health Choice, Veterans' Benefits) • Household income should be no more than 200% of the poverty level •The clinic cannot treat you if you are pregnant or think you are pregnant • Sexually transmitted diseases are not treated at the clinic.  ° ° °Dental Care: °Organization         Address  Phone  Notes  °[place] [facility] [address], [place] (336) 641-6152 Accepts children up to age 21 who are enrolled in Medicaid or [place] Health Choice; pregnant women with a Medicaid card; and children who have applied for Medicaid or [place] Health Choice, but were declined, whose parents can pay a reduced fee at time of service.  °[place]  [facility] [place]  [address], [place] (336) 641-7733 Accepts children up to age 21 who are enrolled in Medicaid or [place] Health Choice; pregnant women with a Medicaid card; and children who have applied for Medicaid or [place] Health Choice, but were declined, whose parents can pay a reduced fee at time of service.  °[facility] ° [address],  (336) 641-4533 Patients are seen by appointment only. Walk-ins are not accepted. [facility] will see patients 18 years of age and older. °Monday - Tuesday (8am-5pm) °Most Wednesdays (8:30-5pm) °$30 per visit, cash only  °[facility] ° [address], [place] (336) 641-4533 Patients are seen by appointment only. Walk-ins are not accepted. [facility] will see patients 18 years of age and older. °One   Wednesday Evening (Monthly: Volunteer Based).  $30 per visit, cash only  °[facility]  (919) 537-3737 for adults; Children under age 4, call Graduate Pediatric Dentistry at (919) [phone]. Children aged 4-14, please call (919) [phone] to request a pediatric application. ° Dental services are provided in all areas of dental care including fillings, crowns and bridges, complete and partial dentures, implants, gum treatment, root canals, and extractions. Preventive care is also provided. Treatment is provided to both adults and children. °Patients are selected via a lottery and there is often a waiting list. °  °[facility] [address], °[place] ° (336) 763-8833 [email or web address] °  °[facility] [address], [place], [place] (336)723-1848, Ext. 123 Second and Fourth Thursday of each month, opens at 6:30 AM; Clinic ends at 9 AM.  Patients are seen on a first-come first-served basis, and a limited number are seen during each clinic.  ° °[facility] ° [address], [place], [place] (336) 723-7904    Eligibility Requirements °You must have lived in [place], [place], or [place] counties for at least the last three months. °  You cannot be eligible for state or federal sponsored healthcare insurance, including Veterans Administration, Medicaid, or Medicare. °  You generally cannot be eligible for healthcare insurance through your employer.  °  How to apply: °Eligibility screenings are held every Tuesday and Wednesday afternoon from 1:00 pm until 4:00 pm. You do not need an appointment for the interview!  °[facility] [address], [place], [place] 336-631-2330   °[facility]  336-342-8273   °[facility]  336-703-3100   °[facility]  336-570-6415   ° °Behavioral Health Resources in the Community: °Intensive Outpatient Programs °Organization         Address  Phone  Notes  °[facility] [address], [place], [place] 336-878-6098   °[facility] [address], [place], [place] 336-832-9800   °[facility] [address], [place], [place] ° 336-882-2125   °[facility] [address],  °[place], [place] 1-800-853-5163 or 336-641-4981   °Substance Abuse Resources °Organization         Address  Phone  Notes  °[facility]  336-882-2125   °[facility]  336-784-9470   °[facility]  336-285-9073   °[facility]  336-845-3988   °Residential & Outpatient Substance Abuse Program  1-800-659-3381   °Psychological Services °Organization         Address  Phone  Notes  °[facility]  336- 832-9600   °[facility]  336- 378-7881   °[facility] [address], [place] 1-800-853-5163 or 336-641-4981   ° °Mobile Crisis Teams °Organization         Address  Phone  Notes  °[facility], Mobile Crisis Care Unit  1-877-626-1772   °Assertive °[facility] ° [address]  [place], [place] 336-834-9664   °Sharon DeEsch [address] °[place] 336-554-5454   ° °Self-Help/Support Groups °Organization         Address  Phone             Notes  °[facility] - variety of support groups  336- 373-1402 Call for more information  °Narcotics Anonymous (NA), [facility] [address], °[place]  2 meetings at this location  ° °  Residential Treatment Programs °Organization         Address  Phone  Notes  °[facility] [address],    °[place]  1-866-801-8205   °[facility] ° [address], [place], [place] 704-293-8524   °[facility] [address], [place] 336-845-3988 Admissions: 8am-3pm M-F  °[facility] [address]    °[place], [place] 336-841-1104   °[facility] [address], [place], [place] 336-379-7146   °[facility] [address]  °[place], [place] 336-285-9073   °[facility] - Intensive Outpatient [address], [place], [place] 336-852-3033   °[facility] [address]  °[place], [place] 1-877-615-2722 or 336-784-9470   °[facility] [address] [place], [place] 336-227-7417 Accepts Medicaid  °[facility] [address]  °[place] 1-800-659-3381 Substance Abuse/Addiction Treatment  ° °[place] Behavioral Health Resources °Organization         Address  Phone  Notes  °[facility]  (888) 581-9988   °Julie Brannon, PhD [address] [place], [place]   (336) 349-5553 or (336) 951-0000   °[facility]   [address] °[place], [place] (336) 349-4454   °[facility] [address], [place], [place] (336) 342-8316 Insurance/Medicaid/sponsorship through [facility]  °[facility] [address]                                    [place], [place] (336) 342-8316 Therapy/tele-psych/case    °[facility] [address]  ° [place], [place] (336) 349-2233    °Dr. Arfeen  (336) 349-4544   °[facility] [place]  [employer] [place] [facility] 1) [address], [place] °2) [address], [place] °3)  [address], [place] (336) 349-3220 °(336) 342-7768 ° °(336) 342-8140   °[place] [facility] (336) 342-1394 or (336) 342-3537 (After Hours)    ° ° °Take the prescriptions as directed.  Apply moist heat or ice to the area(s) of discomfort, for 15 minutes at a time, several times per day for the next few days.  Do not fall asleep on a heating or ice pack.  Call your regular medical doctor tomorrow to schedule a follow up appointment this week.  Return to the Emergency Department immediately if worsening. ° °

## 2016-02-07 ENCOUNTER — Other Ambulatory Visit (HOSPITAL_COMMUNITY): Payer: Self-pay | Admitting: Family Medicine

## 2016-02-07 DIAGNOSIS — Z1231 Encounter for screening mammogram for malignant neoplasm of breast: Secondary | ICD-10-CM

## 2016-02-14 ENCOUNTER — Ambulatory Visit (HOSPITAL_COMMUNITY)
Admission: RE | Admit: 2016-02-14 | Discharge: 2016-02-14 | Disposition: A | Discharge status: Home / Self Care | Payer: BLUE CROSS/BLUE SHIELD | Source: Ambulatory Visit | Attending: Family Medicine | Admitting: Family Medicine

## 2016-02-14 DIAGNOSIS — Z1231 Encounter for screening mammogram for malignant neoplasm of breast: Secondary | ICD-10-CM | POA: Diagnosis not present

## 2016-04-11 ENCOUNTER — Other Ambulatory Visit (HOSPITAL_COMMUNITY): Payer: Self-pay | Admitting: Family Medicine

## 2016-04-11 ENCOUNTER — Ambulatory Visit (HOSPITAL_COMMUNITY)
Admission: RE | Admit: 2016-04-11 | Discharge: 2016-04-11 | Disposition: A | Discharge status: Home / Self Care | Payer: BLUE CROSS/BLUE SHIELD | Source: Ambulatory Visit | Attending: Family Medicine | Admitting: Family Medicine

## 2016-04-11 DIAGNOSIS — M25562 Pain in left knee: Secondary | ICD-10-CM | POA: Insufficient documentation

## 2016-08-24 ENCOUNTER — Telehealth: Payer: Self-pay | Admitting: Orthopedic Surgery

## 2016-08-24 NOTE — Telephone Encounter (Signed)
Patient called and wanted to schedule an appointment.  After speaking with Okey RegalCarol, it was decided that since this patient [facility], she would make an appointment back to see their physicians

## 2017-07-04 ENCOUNTER — Ambulatory Visit: Payer: Medicaid Other | Admitting: Adult Health

## 2017-07-04 ENCOUNTER — Encounter: Payer: Self-pay | Admitting: Adult Health

## 2017-07-04 VITALS — BP 96/54 | HR 88 | Ht 68.5 in | Wt 135.0 lb

## 2017-07-04 DIAGNOSIS — N96 Recurrent pregnancy loss: Secondary | ICD-10-CM | POA: Insufficient documentation

## 2017-07-04 DIAGNOSIS — O09291 Supervision of pregnancy with other poor reproductive or obstetric history, first trimester: Secondary | ICD-10-CM

## 2017-07-04 DIAGNOSIS — Z3201 Encounter for pregnancy test, result positive: Secondary | ICD-10-CM | POA: Diagnosis not present

## 2017-07-04 DIAGNOSIS — O09521 Supervision of elderly multigravida, first trimester: Secondary | ICD-10-CM

## 2017-07-04 DIAGNOSIS — R61 Generalized hyperhidrosis: Secondary | ICD-10-CM

## 2017-07-04 DIAGNOSIS — N926 Irregular menstruation, unspecified: Secondary | ICD-10-CM | POA: Diagnosis not present

## 2017-07-04 DIAGNOSIS — Z3490 Encounter for supervision of normal pregnancy, unspecified, unspecified trimester: Secondary | ICD-10-CM

## 2017-07-04 DIAGNOSIS — O3680X0 Pregnancy with inconclusive fetal viability, not applicable or unspecified: Secondary | ICD-10-CM | POA: Insufficient documentation

## 2017-07-04 DIAGNOSIS — O09529 Supervision of elderly multigravida, unspecified trimester: Secondary | ICD-10-CM | POA: Insufficient documentation

## 2017-07-04 LAB — POCT URINE PREGNANCY: PREG TEST UR: POSITIVE — AB

## 2017-07-04 MED ORDER — PRENATAL PLUS 27-1 MG PO TABS: 1.0000 | ORAL_TABLET | Freq: Every day | ORAL | 12 refills | Status: DC

## 2017-07-04 NOTE — Progress Notes (Signed)
Subjective:     Patient ID: Mariah [name], female   DOB: 03/30/1976, 42 y.o.   MRN: [id number]  HPI Mariah [name] is a 42 year old white female, married, in for UPT, she has missed a period.She has had 3 miscarriages all around 3 weeks, and had fertility issues, and then just got pregnant.She was bitten by dog and had rabies shots in January. She had been on pain meds and stopped them and stopped smoking.   Review of Systems +missed period +night sweats Reviewed past medical,surgical, social and family history. Reviewed medications and allergies.     Objective:   Physical Exam BP (!) 96/54 (BP Location: Left Arm, Patient Position: Sitting, Cuff Size: Normal)   Pulse 88   Ht 5' 8.5" (1.74 m)   Wt 135 lb (61.2 kg)   LMP 05/03/2017 (Within Days)   BMI 20.23 kg/m UPT +, about 8+6 weeks by LMP, with EDD 02/07/18.Skin warm and dry. Neck: mid line trachea, normal thyroid, good ROM, no lymphadenopathy noted. Lungs: clear to ausculation bilaterally. Cardiovascular: regular rate and rhythm.Abdomen is soft and non tender.    Assessment:     1. Pregnancy test positive   2. Pregnancy, unspecified gestational age   913. Encounter to determine fetal viability of pregnancy, single or unspecified fetus   4. Elderly multigravida in first trimester   5. History of miscarriage, currently pregnant, first trimester       Plan:     Meds ordered this encounter  Medications  . prenatal vitamin w/FE, FA (PRENATAL 1 + 1) 27-1 MG TABS tablet    Sig: Take 1 tablet by mouth daily at 12 noon.    Dispense:  30 each    Refill:  12    Order Specific Question:   Supervising Provider    Answer:   Duane LopeEURE, LUTHER H [[id number]]  Check QHCG and progesterone level Return in 1 week for dating US Review handouts on First trimester and by [facility]

## 2017-07-04 NOTE — Patient Instructions (Signed)
First Trimester of Pregnancy The first trimester of pregnancy is from week 1 until the end of week 13 (months 1 through 3). A week after a sperm fertilizes an egg, the egg will implant on the wall of the uterus. This embryo will begin to develop into a baby. Genes from you and your partner will form the baby. The female genes will determine whether the baby will be a boy or a girl. At 6-8 weeks, the eyes and face will be formed, and the heartbeat can be seen on ultrasound. At the end of 12 weeks, all the baby's organs will be formed. Now that you are pregnant, you will want to do everything you can to have a healthy baby. Two of the most important things are to get good prenatal care and to follow your health care provider's instructions. Prenatal care is all the medical care you receive before the baby's birth. This care will help prevent, find, and treat any problems during the pregnancy and childbirth. Body changes during your first trimester Your body goes through many changes during pregnancy. The changes vary from woman to woman.  You may gain or lose a couple of pounds at first.  You may feel sick to your stomach (nauseous) and you may throw up (vomit). If the vomiting is uncontrollable, call your health care provider.  You may tire easily.  You may develop headaches that can be relieved by medicines. All medicines should be approved by your health care provider.  You may urinate more often. Painful urination may mean you have a bladder infection.  You may develop heartburn as a result of your pregnancy.  You may develop constipation because certain hormones are causing the muscles that push stool through your intestines to slow down.  You may develop hemorrhoids or swollen veins (varicose veins).  Your breasts may begin to grow larger and become tender. Your nipples may stick out more, and the tissue that surrounds them (areola) may become darker.  Your gums may bleed and may be  sensitive to brushing and flossing.  Dark spots or blotches (chloasma, mask of pregnancy) may develop on your face. This will likely fade after the baby is born.  Your menstrual periods will stop.  You may have a loss of appetite.  You may develop cravings for certain kinds of food.  You may have changes in your emotions from day to day, such as being excited to be pregnant or being concerned that something may go wrong with the pregnancy and baby.  You may have more vivid and strange dreams.  You may have changes in your hair. These can include thickening of your hair, rapid growth, and changes in texture. Some women also have hair loss during or after pregnancy, or hair that feels dry or thin. Your hair will most likely return to normal after your baby is born.  What to expect at prenatal visits During a routine prenatal visit:  You will be weighed to make sure you and the baby are growing normally.  Your blood pressure will be taken.  Your abdomen will be measured to track your baby's growth.  The fetal heartbeat will be listened to between weeks 10 and 14 of your pregnancy.  Test results from any previous visits will be discussed.  Your health care provider may ask you:  How you are feeling.  If you are feeling the baby move.  If you have had any abnormal symptoms, such as leaking fluid, bleeding, severe headaches,   or abdominal cramping.  If you are using any tobacco products, including cigarettes, chewing tobacco, and electronic cigarettes.  If you have any questions.  Other tests that may be performed during your first trimester include:  Blood tests to find your blood type and to check for the presence of any previous infections. The tests will also be used to check for low iron levels (anemia) and protein on red blood cells (Rh antibodies). Depending on your risk factors, or if you previously had diabetes during pregnancy, you may have tests to check for high blood  sugar that affects pregnant women (gestational diabetes).  Urine tests to check for infections, diabetes, or protein in the urine.  An ultrasound to confirm the proper growth and development of the baby.  Fetal screens for spinal cord problems (spina bifida) and Down syndrome.  HIV (human immunodeficiency virus) testing. Routine prenatal testing includes screening for HIV, unless you choose not to have this test.  You may need other tests to make sure you and the baby are doing well.  Follow these instructions at home: Medicines  Follow your health care provider's instructions regarding medicine use. Specific medicines may be either safe or unsafe to take during pregnancy.  Take a prenatal vitamin that contains at least 600 micrograms (mcg) of folic acid.  If you develop constipation, try taking a stool softener if your health care provider approves. Eating and drinking  Eat a balanced diet that includes fresh fruits and vegetables, whole grains, good sources of protein such as meat, eggs, or tofu, and low-fat dairy. Your health care provider will help you determine the amount of weight gain that is right for you.  Avoid raw meat and uncooked cheese. These carry germs that can cause birth defects in the baby.  Eating four or five small meals rather than three large meals a day may help relieve nausea and vomiting. If you start to feel nauseous, eating a few soda crackers can be helpful. Drinking liquids between meals, instead of during meals, also seems to help ease nausea and vomiting.  Limit foods that are high in fat and processed sugars, such as fried and sweet foods.  To prevent constipation: ? Eat foods that are high in fiber, such as fresh fruits and vegetables, whole grains, and beans. ? Drink enough fluid to keep your urine clear or pale yellow. Activity  Exercise only as directed by your health care provider. Most women can continue their usual exercise routine during  pregnancy. Try to exercise for 30 minutes at least 5 days a week. Exercising will help you: ? Control your weight. ? Stay in shape. ? Be prepared for labor and delivery.  Experiencing pain or cramping in the lower abdomen or lower back is a good sign that you should stop exercising. Check with your health care provider before continuing with normal exercises.  Try to avoid standing for long periods of time. Move your legs often if you must stand in one place for a long time.  Avoid heavy lifting.  Wear low-heeled shoes and practice good posture.  You may continue to have sex unless your health care provider tells you not to. Relieving pain and discomfort  Wear a good support bra to relieve breast tenderness.  Take warm sitz baths to soothe any pain or discomfort caused by hemorrhoids. Use hemorrhoid cream if your health care provider approves.  Rest with your legs elevated if you have leg cramps or low back pain.  If you develop   varicose veins in your legs, wear support hose. Elevate your feet for 15 minutes, 3-4 times a day. Limit salt in your diet. Prenatal care  Schedule your prenatal visits by the twelfth week of pregnancy. They are usually scheduled monthly at first, then more often in the last 2 months before delivery.  Write down your questions. Take them to your prenatal visits.  Keep all your prenatal visits as told by your health care provider. This is important. Safety  Wear your seat belt at all times when driving.  Make a list of emergency phone numbers, including numbers for family, friends, the hospital, and police and fire departments. General instructions  Ask your health care provider for a referral to a local prenatal education class. Begin classes no later than the beginning of month 6 of your pregnancy.  Ask for help if you have counseling or nutritional needs during pregnancy. Your health care provider can offer advice or refer you to specialists for help  with various needs.  Do not use hot tubs, steam rooms, or saunas.  Do not douche or use tampons or scented sanitary pads.  Do not cross your legs for long periods of time.  Avoid cat litter boxes and soil used by cats. These carry germs that can cause birth defects in the baby and possibly loss of the fetus by miscarriage or stillbirth.  Avoid all smoking, herbs, alcohol, and medicines not prescribed by your health care provider. Chemicals in these products affect the formation and growth of the baby.  Do not use any products that contain nicotine or tobacco, such as cigarettes and e-cigarettes. If you need help quitting, ask your health care provider. You may receive counseling support and other resources to help you quit.  Schedule a dentist appointment. At home, brush your teeth with a soft toothbrush and be gentle when you floss. Contact a health care provider if:  You have dizziness.  You have mild pelvic cramps, pelvic pressure, or nagging pain in the abdominal area.  You have persistent nausea, vomiting, or diarrhea.  You have a bad smelling vaginal discharge.  You have pain when you urinate.  You notice increased swelling in your face, hands, legs, or ankles.  You are exposed to fifth disease or chickenpox.  You are exposed to German measles (rubella) and have never had it. Get help right away if:  You have a fever.  You are leaking fluid from your vagina.  You have spotting or bleeding from your vagina.  You have severe abdominal cramping or pain.  You have rapid weight gain or loss.  You vomit blood or material that looks like coffee grounds.  You develop a severe headache.  You have shortness of breath.  You have any kind of trauma, such as from a fall or a car accident. Summary  The first trimester of pregnancy is from week 1 until the end of week 13 (months 1 through 3).  Your body goes through many changes during pregnancy. The changes vary from  woman to woman.  You will have routine prenatal visits. During those visits, your health care provider will examine you, discuss any test results you may have, and talk with you about how you are feeling. This information is not intended to replace advice given to you by your health care provider. Make sure you discuss any questions you have with your health care provider. Document Released: 04/04/2001 Document Revised: 03/22/2016 Document Reviewed: 03/22/2016 Elsevier Interactive Patient Education  2018 [employer]  

## 2017-07-05 ENCOUNTER — Telehealth: Payer: Self-pay | Admitting: Adult Health

## 2017-07-05 LAB — PROGESTERONE: Progesterone: 18.3 ng/mL

## 2017-07-05 LAB — BETA HCG QUANT (REF LAB): HCG QUANT: 147189 m[IU]/mL

## 2017-07-05 NOTE — Telephone Encounter (Signed)
Left message that labs are good, keep US appt

## 2017-07-11 ENCOUNTER — Other Ambulatory Visit: Payer: Self-pay | Admitting: Obstetrics and Gynecology

## 2017-07-11 DIAGNOSIS — O3680X0 Pregnancy with inconclusive fetal viability, not applicable or unspecified: Secondary | ICD-10-CM

## 2017-07-12 ENCOUNTER — Ambulatory Visit (INDEPENDENT_AMBULATORY_CARE_PROVIDER_SITE_OTHER): Payer: Medicaid Other

## 2017-07-12 DIAGNOSIS — O3680X0 Pregnancy with inconclusive fetal viability, not applicable or unspecified: Secondary | ICD-10-CM

## 2017-07-12 DIAGNOSIS — Z3A10 10 weeks gestation of pregnancy: Secondary | ICD-10-CM

## 2017-07-12 NOTE — Progress Notes (Signed)
US 10 wks single IUP,positive fht 152 bpm,normal ovaries bilat,crl 35.31 mm, EDD 02/07/2018 by [facility]

## 2017-07-17 ENCOUNTER — Telehealth: Payer: Self-pay | Admitting: *Deleted

## 2017-07-17 NOTE — Telephone Encounter (Signed)
Pt called stating that she has developed a rash on her abdomen. She states that it is not very itchy. She states that she also has 2 patches on her elbow that she feels like is eczema. She states the only things she has been doing different is drinking a lot of orange juice and has been using coconut oil on her stomach. Advised to try decreasing the amount that she uses those. Advised to call us back if rash worsens. Pt verbalized understanding.

## 2017-08-01 ENCOUNTER — Other Ambulatory Visit: Payer: Self-pay | Admitting: Obstetrics & Gynecology

## 2017-08-01 DIAGNOSIS — Z3682 Encounter for antenatal screening for nuchal translucency: Secondary | ICD-10-CM

## 2017-08-02 ENCOUNTER — Other Ambulatory Visit: Payer: Self-pay

## 2017-08-02 ENCOUNTER — Ambulatory Visit (INDEPENDENT_AMBULATORY_CARE_PROVIDER_SITE_OTHER): Payer: Medicaid Other | Admitting: Advanced Practice Midwife

## 2017-08-02 ENCOUNTER — Encounter: Payer: Self-pay | Admitting: Advanced Practice Midwife

## 2017-08-02 ENCOUNTER — Ambulatory Visit (INDEPENDENT_AMBULATORY_CARE_PROVIDER_SITE_OTHER): Payer: Medicaid Other

## 2017-08-02 ENCOUNTER — Ambulatory Visit: Payer: Medicaid Other | Admitting: *Deleted

## 2017-08-02 VITALS — BP 110/78 | HR 86 | Wt 145.0 lb

## 2017-08-02 DIAGNOSIS — Z1379 Encounter for other screening for genetic and chromosomal anomalies: Secondary | ICD-10-CM

## 2017-08-02 DIAGNOSIS — Z3682 Encounter for antenatal screening for nuchal translucency: Secondary | ICD-10-CM | POA: Diagnosis not present

## 2017-08-02 DIAGNOSIS — Z363 Encounter for antenatal screening for malformations: Secondary | ICD-10-CM

## 2017-08-02 DIAGNOSIS — O0990 Supervision of high risk pregnancy, unspecified, unspecified trimester: Secondary | ICD-10-CM

## 2017-08-02 DIAGNOSIS — Z3A13 13 weeks gestation of pregnancy: Secondary | ICD-10-CM

## 2017-08-02 DIAGNOSIS — Z1389 Encounter for screening for other disorder: Secondary | ICD-10-CM

## 2017-08-02 DIAGNOSIS — O09521 Supervision of elderly multigravida, first trimester: Secondary | ICD-10-CM | POA: Diagnosis not present

## 2017-08-02 DIAGNOSIS — Z331 Pregnant state, incidental: Secondary | ICD-10-CM

## 2017-08-02 LAB — POCT URINALYSIS DIPSTICK
Glucose, UA: NEGATIVE
Ketones, UA: NEGATIVE
Leukocytes, UA: NEGATIVE
NITRITE UA: NEGATIVE
Protein, UA: NEGATIVE
RBC UA: NEGATIVE

## 2017-08-02 MED ORDER — ASPIRIN EC 81 MG PO TBEC: 162.0000 mg | DELAYED_RELEASE_TABLET | Freq: Every day | ORAL | 6 refills | Status: DC

## 2017-08-02 NOTE — Patient Instructions (Signed)
 First Trimester of Pregnancy The first trimester of pregnancy is from week 1 until the end of week 12 (months 1 through 3). A week after a sperm fertilizes an egg, the egg will implant on the wall of the uterus. This embryo will begin to develop into a baby. Genes from you and your partner are forming the baby. The female genes determine whether the baby is a boy or a girl. At 6-8 weeks, the eyes and face are formed, and the heartbeat can be seen on ultrasound. At the end of 12 weeks, all the baby's organs are formed.  Now that you are pregnant, you will want to do everything you can to have a healthy baby. Two of the most important things are to get good prenatal care and to follow your health care provider's instructions. Prenatal care is all the medical care you receive before the baby's birth. This care will help prevent, find, and treat any problems during the pregnancy and childbirth. BODY CHANGES Your body goes through many changes during pregnancy. The changes vary from woman to woman.   You may gain or lose a couple of pounds at first.  You may feel sick to your stomach (nauseous) and throw up (vomit). If the vomiting is uncontrollable, call your health care provider.  You may tire easily.  You may develop headaches that can be relieved by medicines approved by your health care provider.  You may urinate more often. Painful urination may mean you have a bladder infection.  You may develop heartburn as a result of your pregnancy.  You may develop constipation because certain hormones are causing the muscles that push waste through your intestines to slow down.  You may develop hemorrhoids or swollen, bulging veins (varicose veins).  Your breasts may begin to grow larger and become tender. Your nipples may stick out more, and the tissue that surrounds them (areola) may become darker.  Your gums may bleed and may be sensitive to brushing and flossing.  Dark spots or blotches  (chloasma, mask of pregnancy) may develop on your face. This will likely fade after the baby is born.  Your menstrual periods will stop.  You may have a loss of appetite.  You may develop cravings for certain kinds of food.  You may have changes in your emotions from day to day, such as being excited to be pregnant or being concerned that something may go wrong with the pregnancy and baby.  You may have more vivid and strange dreams.  You may have changes in your hair. These can include thickening of your hair, rapid growth, and changes in texture. Some women also have hair loss during or after pregnancy, or hair that feels dry or thin. Your hair will most likely return to normal after your baby is born. WHAT TO EXPECT AT YOUR PRENATAL VISITS During a routine prenatal visit:  You will be weighed to make sure you and the baby are growing normally.  Your blood pressure will be taken.  Your abdomen will be measured to track your baby's growth.  The fetal heartbeat will be listened to starting around week 10 or 12 of your pregnancy.  Test results from any previous visits will be discussed. Your health care provider may ask you:  How you are feeling.  If you are feeling the baby move.  If you have had any abnormal symptoms, such as leaking fluid, bleeding, severe headaches, or abdominal cramping.  If you have any questions. Other   tests that may be performed during your first trimester include:  Blood tests to find your blood type and to check for the presence of any previous infections. They will also be used to check for low iron levels (anemia) and Rh antibodies. Later in the pregnancy, blood tests for diabetes will be done along with other tests if problems develop.  Urine tests to check for infections, diabetes, or protein in the urine.  An ultrasound to confirm the proper growth and development of the baby.  An amniocentesis to check for possible genetic problems.  Fetal  screens for spina bifida and Down syndrome.  You may need other tests to make sure you and the baby are doing well. HOME CARE INSTRUCTIONS  Medicines  Follow your health care provider's instructions regarding medicine use. Specific medicines may be either safe or unsafe to take during pregnancy.  Take your prenatal vitamins as directed.  If you develop constipation, try taking a stool softener if your health care provider approves. Diet  Eat regular, well-balanced meals. Choose a variety of foods, such as meat or vegetable-based protein, fish, milk and low-fat dairy products, vegetables, fruits, and whole grain breads and cereals. Your health care provider will help you determine the amount of weight gain that is right for you.  Avoid raw meat and uncooked cheese. These carry germs that can cause birth defects in the baby.  Eating four or five small meals rather than three large meals a day may help relieve nausea and vomiting. If you start to feel nauseous, eating a few soda crackers can be helpful. Drinking liquids between meals instead of during meals also seems to help nausea and vomiting.  If you develop constipation, eat more high-fiber foods, such as fresh vegetables or fruit and whole grains. Drink enough fluids to keep your urine clear or pale yellow. Activity and Exercise  Exercise only as directed by your health care provider. Exercising will help you:  Control your weight.  Stay in shape.  Be prepared for labor and delivery.  Experiencing pain or cramping in the lower abdomen or low back is a good sign that you should stop exercising. Check with your health care provider before continuing normal exercises.  Try to avoid standing for long periods of time. Move your legs often if you must stand in one place for a long time.  Avoid heavy lifting.  Wear low-heeled shoes, and practice good posture.  You may continue to have sex unless your health care provider directs you  otherwise. Relief of Pain or Discomfort  Wear a good support bra for breast tenderness.   Take warm sitz baths to soothe any pain or discomfort caused by hemorrhoids. Use hemorrhoid cream if your health care provider approves.   Rest with your legs elevated if you have leg cramps or low back pain.  If you develop varicose veins in your legs, wear support hose. Elevate your feet for 15 minutes, 3-4 times a day. Limit salt in your diet. Prenatal Care  Schedule your prenatal visits by the twelfth week of pregnancy. They are usually scheduled monthly at first, then more often in the last 2 months before delivery.  Write down your questions. Take them to your prenatal visits.  Keep all your prenatal visits as directed by your health care provider. Safety  Wear your seat belt at all times when driving.  Make a list of emergency phone numbers, including numbers for family, friends, the hospital, and police and fire departments. General   Tips  Ask your health care provider for a referral to a local prenatal education class. Begin classes no later than at the beginning of month 6 of your pregnancy.  Ask for help if you have counseling or nutritional needs during pregnancy. Your health care provider can offer advice or refer you to specialists for help with various needs.  Do not use hot tubs, steam rooms, or saunas.  Do not douche or use tampons or scented sanitary pads.  Do not cross your legs for long periods of time.  Avoid cat litter boxes and soil used by cats. These carry germs that can cause birth defects in the baby and possibly loss of the fetus by miscarriage or stillbirth.  Avoid all smoking, herbs, alcohol, and medicines not prescribed by your health care provider. Chemicals in these affect the formation and growth of the baby.  Schedule a dentist appointment. At home, brush your teeth with a soft toothbrush and be gentle when you floss. SEEK MEDICAL CARE IF:   You have  dizziness.  You have mild pelvic cramps, pelvic pressure, or nagging pain in the abdominal area.  You have persistent nausea, vomiting, or diarrhea.  You have a bad smelling vaginal discharge.  You have pain with urination.  You notice increased swelling in your face, hands, legs, or ankles. SEEK IMMEDIATE MEDICAL CARE IF:   You have a fever.  You are leaking fluid from your vagina.  You have spotting or bleeding from your vagina.  You have severe abdominal cramping or pain.  You have rapid weight gain or loss.  You vomit blood or material that looks like coffee grounds.  You are exposed to German measles and have never had them.  You are exposed to fifth disease or chickenpox.  You develop a severe headache.  You have shortness of breath.  You have any kind of trauma, such as from a fall or a car accident. Document Released: 04/04/2001 Document Revised: 08/25/2013 Document Reviewed: 02/18/2013 [employer] Patient Information 2015 [employer]. This information is not intended to replace advice given to you by your health care provider. Make sure you discuss any questions you have with your health care provider.   Nausea & Vomiting  Have saltine crackers or pretzels by your bed and eat a few bites before you raise your head out of bed in the morning  Eat small frequent meals throughout the day instead of large meals  Drink plenty of fluids throughout the day to stay hydrated, just don't drink a lot of fluids with your meals.  This can make your stomach fill up faster making you feel sick  Do not brush your teeth right after you eat  Products with real ginger are good for nausea, like ginger ale and ginger hard candy Make sure it says made with real ginger!  Sucking on sour candy like lemon heads is also good for nausea  If your prenatal vitamins make you nauseated, take them at night so you will sleep through the nausea  Sea Bands  If you feel like you need  medicine for the nausea & vomiting please let us know  If you are unable to keep any fluids or food down please let us know   Constipation  Drink plenty of fluid, preferably water, throughout the day  Eat foods high in fiber such as fruits, vegetables, and grains  Exercise, such as walking, is a good way to keep your bowels regular  Drink warm fluids, especially warm   prune juice, or decaf coffee  Eat a 1/2 cup of real oatmeal (not instant), 1/2 cup applesauce, and 1/2-1 cup warm prune juice every day  If needed, you may take Colace (docusate sodium) stool softener once or twice a day to help keep the stool soft. If you are pregnant, wait until you are out of your first trimester (12-14 weeks of pregnancy)  If you still are having problems with constipation, you may take Miralax once daily as needed to help keep your bowels regular.  If you are pregnant, wait until you are out of your first trimester (12-14 weeks of pregnancy)  Safe Medications in Pregnancy   Acne: Benzoyl Peroxide Salicylic Acid  Backache/Headache: Tylenol: 2 regular strength every 4 hours OR              2 Extra strength every 6 hours  Colds/Coughs/Allergies: Benadryl (alcohol free) 25 mg every 6 hours as needed Breath right strips Claritin Cepacol throat lozenges Chloraseptic throat spray Cold-Eeze- up to three times per day Cough drops, alcohol free Flonase (by prescription only) Guaifenesin Mucinex Robitussin DM (plain only, alcohol free) Saline nasal spray/drops Sudafed (pseudoephedrine) & Actifed ** use only after [redacted] weeks gestation and if you do not have high blood pressure Tylenol Vicks Vaporub Zinc lozenges Zyrtec   Constipation: Colace Ducolax suppositories Fleet enema Glycerin suppositories Metamucil Milk of magnesia Miralax Senokot Smooth move tea  Diarrhea: Kaopectate Imodium A-D  *NO pepto Bismol  Hemorrhoids: Anusol Anusol HC Preparation  H Tucks  Indigestion: Tums Maalox Mylanta Zantac  Pepcid  Insomnia: Benadryl (alcohol free) 25mg every 6 hours as needed Tylenol PM Unisom, no Gelcaps  Leg Cramps: Tums MagGel  Nausea/Vomiting:  Bonine Dramamine Emetrol Ginger extract Sea bands Meclizine  Nausea medication to take during pregnancy:  Unisom (doxylamine succinate 25 mg tablets) Take one tablet daily at bedtime. If symptoms are not adequately controlled, the dose can be increased to a maximum recommended dose of two tablets daily (1/2 tablet in the morning, 1/2 tablet mid-afternoon and one at bedtime). Vitamin B6 100mg tablets. Take one tablet twice a day (up to 200 mg per day).  Skin Rashes: Aveeno products Benadryl cream or 25mg every 6 hours as needed Calamine Lotion 1% cortisone cream  Yeast infection: Gyne-lotrimin 7 Monistat 7   **If taking multiple medications, please check labels to avoid duplicating the same active ingredients **take medication as directed on the label ** Do not exceed 4000 mg of tylenol in 24 hours **Do not take medications that contain aspirin or ibuprofen      

## 2017-08-02 NOTE — Progress Notes (Signed)
Subjective:    Mariah [name] is a G4P0030 [id number] being seen today for her first obstetrical visit.  Her obstetrical history is significant for 3 early SABs. .  Pregnancy history fully reviewed. She and husband had tried IUI a few times w/o success, quit trying to get pregnant 2 years ago.  Feels well  Patient reports no complaints.  Vitals:   08/02/17 0953  BP: 110/78  Pulse: 86  Weight: 65.8 kg (145 lb)    HISTORY: OB History  Gravida Para Term Preterm AB Living  4       3 0  SAB TAB Ectopic Multiple Live Births  3            # Outcome Date GA Lbr Len/2nd Weight Sex Delivery Anes PTL Lv  4 Current           3 SAB           2 SAB           1 SAB            Past Medical History:  Diagnosis Date  . Anxiety   . Chronic chest wall pain   . Headache    migraines  . Vertigo    Past Surgical History:  Procedure Laterality Date  . KNEE ARTHROSCOPY    . WISDOM TOOTH EXTRACTION     Family History  Problem Relation Age of Onset  . Heart disease Paternal Grandfather   . Heart attack Paternal Grandfather   . Diabetes Paternal Grandmother   . Hypertension Paternal Grandmother   . Asthma Paternal Grandmother   . Cancer Paternal Grandmother        SKIN   . Atrial fibrillation Paternal Grandmother        PACEMAKER  . Hypertension Father   . COPD Father   . Heart attack Father   . Diabetes Mother   . Hypertension Mother   . Mental illness Mother        bipolar  . Cancer Brother        skin  . Hypertension Brother   . Cancer Paternal Aunt        brain   . Cancer Paternal Uncle        bone     Exam   US 13 wks,measurements c/w dates,normal ovaries bilat,NB present,NT 1.4 mm,crl 77.46 mm,anterior pl gr 0,fhr 147 bpm                                     System:     Skin: normal coloration and turgor, no rashes    Neurologic: oriented, normal, normal mood   Extremities: normal strength, tone, and muscle mass   HEENT PERRLA   Mouth/Teeth mucous  membranes moist, normal dentition   Neck supple and no masses   Cardiovascular: regular rate and rhythm   Respiratory:  appears well, vitals normal, no respiratory distress, acyanotic   Abdomen: soft, non-tender;          The [facility] with multiple MDs and other Advanced Practice Providers was explained to patient; also emphasized that residents, students are part of our team.  Assessment:    Pregnancy: G4P0030 Patient Active Problem List   Diagnosis Date Noted  . Supervision of high risk pregnancy, antepartum 08/02/2017  . History of miscarriage, currently pregnant, first trimester 07/04/2017  . Elderly  multigravida in first trimester 07/04/2017  . Encounter to determine fetal viability of pregnancy 07/04/2017  . Pregnancy 07/04/2017  . Pregnancy test positive 07/04/2017        Plan:     Initial labs drawn. Continue prenatal vitamins  Problem list reviewed and updated  ASA 162 mg daily Reviewed recommended weight gain based on pre-gravid BMI  Encouraged well-balanced diet Genetic Screening discussed Integrated Screen: results reviewed.  Ultrasound discussed; fetal survey: requested.  Return in about 3 weeks (around 08/23/2017) for HROB and 5 weeks for HROB/anatomy scan.  Jacklyn Shell 08/03/2017

## 2017-08-02 NOTE — Progress Notes (Signed)
US 13 wks,measurements c/w dates,normal ovaries bilat,NB present,NT 1.4 mm,crl 77.46 mm,anterior pl gr 0,fhr 147 bpm

## 2017-08-03 LAB — PMP SCREEN PROFILE (10S), URINE
Amphetamine Scrn, Ur: NEGATIVE ng/mL
BARBITURATE SCREEN URINE: NEGATIVE ng/mL
BENZODIAZEPINE SCREEN, URINE: NEGATIVE ng/mL
CANNABINOIDS UR QL SCN: NEGATIVE ng/mL
CREATININE(CRT), U: 24 mg/dL (ref 20.0–300.0)
Cocaine (Metab) Scrn, Ur: NEGATIVE ng/mL
Methadone Screen, Urine: NEGATIVE ng/mL
OXYCODONE+OXYMORPHONE UR QL SCN: NEGATIVE ng/mL
Opiate Scrn, Ur: NEGATIVE ng/mL
Ph of Urine: 6.6 (ref 4.5–8.9)
Phencyclidine Qn, Ur: NEGATIVE ng/mL
Propoxyphene Scrn, Ur: NEGATIVE ng/mL

## 2017-08-04 LAB — OBSTETRIC PANEL, INCLUDING HIV
Antibody Screen: NEGATIVE
Basophils Absolute: 0.1 10*3/uL (ref 0.0–0.2)
Basos: 1 %
EOS (ABSOLUTE): 0.3 10*3/uL (ref 0.0–0.4)
EOS: 3 %
HEMATOCRIT: 37.6 % (ref 34.0–46.6)
HEMOGLOBIN: 12.9 g/dL (ref 11.1–15.9)
HEP B S AG: NEGATIVE
HIV Screen 4th Generation wRfx: NONREACTIVE
IMMATURE GRANS (ABS): 0 10*3/uL (ref 0.0–0.1)
Immature Granulocytes: 0 %
LYMPHS: 17 %
Lymphocytes Absolute: 1.7 10*3/uL (ref 0.7–3.1)
MCH: 30.4 pg (ref 26.6–33.0)
MCHC: 34.3 g/dL (ref 31.5–35.7)
MCV: 89 fL (ref 79–97)
MONOCYTES: 7 %
MONOS ABS: 0.7 10*3/uL (ref 0.1–0.9)
NEUTROS PCT: 72 %
Neutrophils Absolute: 7.6 10*3/uL — ABNORMAL HIGH (ref 1.4–7.0)
Platelets: 264 10*3/uL (ref 150–379)
RBC: 4.25 x10E6/uL (ref 3.77–5.28)
RDW: 13.5 % (ref 12.3–15.4)
RH TYPE: POSITIVE
RPR Ser Ql: NONREACTIVE
Rubella Antibodies, IGG: 1.92 index (ref >0.99)
WBC: 10.4 10*3/uL (ref 3.4–10.8)

## 2017-08-04 LAB — INTEGRATED 1
CROWN RUMP LENGTH MAT SCREEN: 77.5 mm
Gest. Age on Collection Date: 13.6 weeks
Maternal Age at EDD: 42.7 yr
Nuchal Translucency (NT): 1.4 mm
Number of Fetuses: 1
PAPP-A Value: 1592.2 ng/mL
Weight: 145 [lb_av]

## 2017-08-04 LAB — URINALYSIS, ROUTINE W REFLEX MICROSCOPIC
Bilirubin, UA: NEGATIVE
Glucose, UA: NEGATIVE
KETONES UA: NEGATIVE
Leukocytes, UA: NEGATIVE
NITRITE UA: NEGATIVE
Protein, UA: NEGATIVE
RBC, UA: NEGATIVE
SPEC GRAV UA: 1.005 (ref 1.005–1.030)
UUROB: 0.2 mg/dL (ref 0.2–1.0)
pH, UA: 7 (ref 5.0–7.5)

## 2017-08-04 LAB — URINE CULTURE

## 2017-08-09 ENCOUNTER — Emergency Department (HOSPITAL_COMMUNITY)
Admission: EM | Admit: 2017-08-09 | Discharge: 2017-08-09 | Disposition: A | Discharge status: Home / Self Care | Payer: Medicaid Other | Attending: Emergency Medicine | Admitting: Emergency Medicine

## 2017-08-09 ENCOUNTER — Encounter: Payer: Self-pay | Admitting: Advanced Practice Midwife

## 2017-08-09 ENCOUNTER — Encounter (HOSPITAL_COMMUNITY): Payer: Self-pay

## 2017-08-09 ENCOUNTER — Other Ambulatory Visit: Payer: Self-pay

## 2017-08-09 DIAGNOSIS — O9989 Other specified diseases and conditions complicating pregnancy, childbirth and the puerperium: Secondary | ICD-10-CM | POA: Insufficient documentation

## 2017-08-09 DIAGNOSIS — R51 Headache: Secondary | ICD-10-CM | POA: Insufficient documentation

## 2017-08-09 DIAGNOSIS — Z7982 Long term (current) use of aspirin: Secondary | ICD-10-CM | POA: Diagnosis not present

## 2017-08-09 DIAGNOSIS — R519 Headache, unspecified: Secondary | ICD-10-CM

## 2017-08-09 DIAGNOSIS — Z3A14 14 weeks gestation of pregnancy: Secondary | ICD-10-CM | POA: Diagnosis not present

## 2017-08-09 DIAGNOSIS — Z87891 Personal history of nicotine dependence: Secondary | ICD-10-CM | POA: Diagnosis not present

## 2017-08-09 DIAGNOSIS — Z79899 Other long term (current) drug therapy: Secondary | ICD-10-CM | POA: Diagnosis not present

## 2017-08-09 DIAGNOSIS — R630 Anorexia: Secondary | ICD-10-CM | POA: Diagnosis not present

## 2017-08-09 MED ORDER — OXYCODONE-ACETAMINOPHEN 5-325 MG PO TABS
1.0000 | ORAL_TABLET | Freq: Once | ORAL | Status: AC
  Administered 2017-08-09: 1 via ORAL
  Filled 2017-08-09: qty 1

## 2017-08-09 MED ORDER — ONDANSETRON 4 MG PO TBDP
4.0000 mg | ORAL_TABLET | Freq: Once | ORAL | Status: AC
  Administered 2017-08-09: 4 mg via ORAL
  Filled 2017-08-09: qty 1

## 2017-08-09 MED ORDER — HYDROCODONE-ACETAMINOPHEN 5-325 MG PO TABS
1.0000 | ORAL_TABLET | Freq: Four times a day (QID) | ORAL | 0 refills | Status: DC | PRN
Start: 2017-08-09 — End: 2017-10-19

## 2017-08-09 MED ORDER — ONDANSETRON 4 MG PO TBDP: ORAL_TABLET | ORAL | 0 refills | Status: DC

## 2017-08-09 NOTE — ED Triage Notes (Signed)
Pt reports is [redacted] weeks pregnant.  C/O headache that started yesterday.  Vomited x 1 today.

## 2017-08-09 NOTE — ED Provider Notes (Signed)
[facility] EMERGENCY DEPARTMENT Provider Note   CSN: [id number] Arrival date & time: 08/09/17  4098     History   Chief Complaint Chief Complaint  Patient presents with  . Headache    HPI Mariah [name] is a 42 y.o. female.  Patient complains of a headache.  She has a history of migraines but has not not had a bad headache in a while  The history is provided by the patient.  Headache   This is a new problem. The current episode started more than 2 days ago. The problem occurs constantly. The headache is associated with nothing. The pain is located in the bilateral region. The pain is at a severity of 5/10. The pain is moderate. The pain does not radiate. Associated symptoms include anorexia. She has tried nothing for the symptoms.    Past Medical History:  Diagnosis Date  . Anxiety   . Chronic chest wall pain   . Headache    migraines  . Vertigo     Patient Active Problem List   Diagnosis Date Noted  . Supervision of high risk pregnancy, antepartum 08/02/2017  . History of miscarriage, currently pregnant, first trimester 07/04/2017  . Elderly multigravida in first trimester 07/04/2017  . Encounter to determine fetal viability of pregnancy 07/04/2017  . Pregnancy 07/04/2017  . Pregnancy test positive 07/04/2017    Past Surgical History:  Procedure Laterality Date  . KNEE ARTHROSCOPY    . WISDOM TOOTH EXTRACTION       OB History    Gravida  4   Para      Term      Preterm      AB  3   Living  0     SAB  3   TAB      Ectopic      Multiple      Live Births               Home Medications    Prior to Admission medications   Medication Sig Start Date End Date Taking? Authorizing Provider  aspirin EC 81 MG tablet Take 2 tablets (162 mg total) by mouth daily. 08/02/17   Cresenzo-Dishmon, Scarlette Calico, CNM  cetirizine (ZYRTEC) 10 MG tablet Take 10 mg by mouth daily.    [provider]  HYDROcodone-acetaminophen (NORCO/VICODIN) 5-325  MG tablet Take 1 tablet by mouth every 6 (six) hours as needed for moderate pain. 08/09/17   Bethann Berkshire, MD  ondansetron (ZOFRAN ODT) 4 MG disintegrating tablet 4mg  ODT q4 hours prn nausea/vomit 08/09/17   Bethann Berkshire, MD  prenatal vitamin w/FE, FA (PRENATAL 1 + 1) 27-1 MG TABS tablet Take 1 tablet by mouth daily at 12 noon. 07/04/17   Adline Potter, NP  promethazine (PHENERGAN) 25 MG tablet Take one every 4-6 hours for nauseau Patient not taking: Reported on 03/23/2014 05/04/12 03/23/14  Bethann Berkshire, MD    Family History Family History  Problem Relation Age of Onset  . Heart disease Paternal Grandfather   . Heart attack Paternal Grandfather   . Diabetes Paternal Grandmother   . Hypertension Paternal Grandmother   . Asthma Paternal Grandmother   . Cancer Paternal Grandmother        SKIN   . Atrial fibrillation Paternal Grandmother        PACEMAKER  . Hypertension Father   . COPD Father   . Heart attack Father   . Diabetes Mother   . Hypertension Mother   .  Mental illness Mother        bipolar  . Cancer Brother        skin  . Hypertension Brother   . Cancer Paternal Aunt        brain   . Cancer Paternal Uncle        bone    Social History Social History   Tobacco Use  . Smoking status: Former Smoker    Types: Cigarettes    Last attempt to quit: 05/27/2014    Years since quitting: 3.2  . Smokeless tobacco: Never Used  Substance Use Topics  . Alcohol use: Not Currently    Frequency: Never  . Drug use: No     Allergies   Penicillins   Review of Systems Review of Systems  Constitutional: Negative for appetite change and fatigue.  HENT: Negative for congestion, ear discharge and sinus pressure.   Eyes: Negative for discharge.  Respiratory: Negative for cough.   Cardiovascular: Negative for chest pain.  Gastrointestinal: Positive for anorexia. Negative for abdominal pain and diarrhea.  Genitourinary: Negative for frequency and hematuria.    Musculoskeletal: Negative for back pain.  Skin: Negative for rash.  Neurological: Positive for headaches. Negative for seizures.  Psychiatric/Behavioral: Negative for hallucinations.     Physical Exam Updated Vital Signs BP 122/83 (BP Location: Right Arm)   Pulse 85   Temp 97.7 F (36.5 C) (Oral)   Resp 18   Ht 5\' 9"  (1.753 m)   Wt 65.8 kg (145 lb)   LMP 05/03/2017 (Within Days)   SpO2 96%   BMI 21.41 kg/m   Physical Exam  Constitutional: She is oriented to person, place, and time. She appears well-developed.  HENT:  Head: Normocephalic.  Eyes: Conjunctivae and EOM are normal. No scleral icterus.  Neck: Neck supple. No thyromegaly present.  Cardiovascular: Normal rate and regular rhythm. Exam reveals no gallop and no friction rub.  No murmur heard. Pulmonary/Chest: No stridor. She has no wheezes. She has no rales. She exhibits no tenderness.  Abdominal: She exhibits no distension. There is no tenderness. There is no rebound.  Musculoskeletal: Normal range of motion. She exhibits no edema.  Lymphadenopathy:    She has no cervical adenopathy.  Neurological: She is oriented to person, place, and time. She exhibits normal muscle tone. Coordination normal.  Skin: No rash noted. No erythema.  Psychiatric: She has a normal mood and affect. Her behavior is normal.     ED Treatments / Results  Labs (all labs ordered are listed, but only abnormal results are displayed) Labs Reviewed - No data to display  EKG None  Radiology No results found.  Procedures Procedures (including critical care time)  Medications Ordered in ED Medications  ondansetron (ZOFRAN-ODT) disintegrating tablet 4 mg (4 mg Oral Given 08/09/17 0833)  oxyCODONE-acetaminophen (PERCOCET/ROXICET) 5-325 MG per tablet 1 tablet (1 tablet Oral Given 08/09/17 [id number])     Initial Impression / Assessment and Plan / ED Course  I have reviewed the triage vital signs and the nursing notes.  Pertinent labs &  imaging results that were available during my care of the patient were reviewed by me and considered in my medical decision making (see chart for details).     Patient improved with Zofran and Percocets.  She will be given 3 hydrocodone and 3 Zofran to take if needed and she is to follow-up with her OB/GYN Final Clinical Impressions(s) / ED Diagnoses   Final diagnoses:  Bad headache    ED  Discharge Orders        Ordered    ondansetron (ZOFRAN ODT) 4 MG disintegrating tablet     08/09/17 0945    HYDROcodone-acetaminophen (NORCO/VICODIN) 5-325 MG tablet  Every 6 hours PRN     08/09/17 0945       Bethann Berkshire, MD 08/09/17 [phone]

## 2017-08-09 NOTE — Discharge Instructions (Addendum)
Drink plenty of fluids and follow-up with your doctor as planned 

## 2017-08-23 ENCOUNTER — Ambulatory Visit (INDEPENDENT_AMBULATORY_CARE_PROVIDER_SITE_OTHER): Payer: Medicaid Other | Admitting: Women's Health

## 2017-08-23 ENCOUNTER — Encounter: Payer: Self-pay | Admitting: Women's Health

## 2017-08-23 ENCOUNTER — Other Ambulatory Visit: Payer: Self-pay

## 2017-08-23 VITALS — BP 120/72 | HR 99 | Wt 156.0 lb

## 2017-08-23 DIAGNOSIS — Z1389 Encounter for screening for other disorder: Secondary | ICD-10-CM

## 2017-08-23 DIAGNOSIS — Z331 Pregnant state, incidental: Secondary | ICD-10-CM

## 2017-08-23 DIAGNOSIS — M5430 Sciatica, unspecified side: Secondary | ICD-10-CM

## 2017-08-23 DIAGNOSIS — Z3A16 16 weeks gestation of pregnancy: Secondary | ICD-10-CM

## 2017-08-23 DIAGNOSIS — O0990 Supervision of high risk pregnancy, unspecified, unspecified trimester: Secondary | ICD-10-CM

## 2017-08-23 DIAGNOSIS — Z113 Encounter for screening for infections with a predominantly sexual mode of transmission: Secondary | ICD-10-CM

## 2017-08-23 DIAGNOSIS — O9989 Other specified diseases and conditions complicating pregnancy, childbirth and the puerperium: Secondary | ICD-10-CM

## 2017-08-23 DIAGNOSIS — O0992 Supervision of high risk pregnancy, unspecified, second trimester: Secondary | ICD-10-CM

## 2017-08-23 DIAGNOSIS — O09522 Supervision of elderly multigravida, second trimester: Secondary | ICD-10-CM

## 2017-08-23 DIAGNOSIS — Z363 Encounter for antenatal screening for malformations: Secondary | ICD-10-CM

## 2017-08-23 DIAGNOSIS — R51 Headache: Secondary | ICD-10-CM

## 2017-08-23 DIAGNOSIS — Z1379 Encounter for other screening for genetic and chromosomal anomalies: Secondary | ICD-10-CM

## 2017-08-23 LAB — POCT URINALYSIS DIPSTICK
GLUCOSE UA: NEGATIVE
Ketones, UA: NEGATIVE
LEUKOCYTES UA: NEGATIVE
Nitrite, UA: NEGATIVE
Protein, UA: NEGATIVE
RBC UA: NEGATIVE

## 2017-08-23 NOTE — Patient Instructions (Addendum)
Mariah [name], I greatly value your feedback.  If you receive a survey following your visit with [place] today, we appreciate you taking the time to fill it out.  Thanks, [name], CNM, WHNP-BC  For Headaches:   Stay well hydrated, drink enough water so that your urine is clear, sometimes if you are dehydrated you can get headaches  Eat small frequent meals and snacks, sometimes if you are hungry you can get headaches  Sometimes you get headaches during pregnancy from the pregnancy hormones  You can try tylenol (1-2 regular strength  or 1-2 extra strength ) as directed on the box. The least amount of medication that works is best.   Cool compresses (cool wet washcloth or ice pack) to area of head that is hurting  You can also try drinking a caffeinated drink to see if this will help  If not helping, try below:  For Prevention of Headaches/Migraines:  CoQ10  three times daily  Vitamin B2  daily  Magnesium Oxide 400-600mg  daily  If You Get a Bad Headache/Migraine:  Benadryl    Magnesium Oxide  1 large Gatorade  2 extra strength Tylenol (1,000mg  total)  1 cup coffee or Coke  If this doesn't help please call us @ [phone]     Second Trimester of Pregnancy The second trimester is from week 14 through week 27 (months 4 through 6). The second trimester is often a time when you feel your best. Your body has adjusted to being pregnant, and you begin to feel better physically. Usually, morning sickness has lessened or quit completely, you may have more energy, and you may have an increase in appetite. The second trimester is also a time when the fetus is growing rapidly. At the end of the sixth month, the fetus is about 9 inches long and weighs about 1 pounds. You will likely begin to feel the baby move (quickening) between 16 and 20 weeks of pregnancy. Body changes during your second trimester Your body continues to go through many changes during  your second trimester. The changes vary from woman to woman.  Your weight will continue to increase. You will notice your lower abdomen bulging out.  You may begin to get stretch marks on your hips, abdomen, and breasts.  You may develop headaches that can be relieved by medicines. The medicines should be approved by your health care provider.  You may urinate more often because the fetus is pressing on your bladder.  You may develop or continue to have heartburn as a result of your pregnancy.  You may develop constipation because certain hormones are causing the muscles that push waste through your intestines to slow down.  You may develop hemorrhoids or swollen, bulging veins (varicose veins).  You may have back pain. This is caused by: ? Weight gain. ? Pregnancy hormones that are relaxing the joints in your pelvis. ? A shift in weight and the muscles that support your balance.  Your breasts will continue to grow and they will continue to become tender.  Your gums may bleed and may be sensitive to brushing and flossing.  Dark spots or blotches (chloasma, mask of pregnancy) may develop on your face. This will likely fade after the baby is born.  A dark line from your belly button to the pubic area (linea nigra) may appear. This will likely fade after the baby is born.  You may have changes in your hair. These can include thickening of your hair, rapid growth,  and changes in texture. Some women also have hair loss during or after pregnancy, or hair that feels dry or thin. Your hair will most likely return to normal after your baby is born.  What to expect at prenatal visits During a routine prenatal visit:  You will be weighed to make sure you and the fetus are growing normally.  Your blood pressure will be taken.  Your abdomen will be measured to track your baby's growth.  The fetal heartbeat will be listened to.  Any test results from the previous visit will be  discussed.  Your health care provider may ask you:  How you are feeling.  If you are feeling the baby move.  If you have had any abnormal symptoms, such as leaking fluid, bleeding, severe headaches, or abdominal cramping.  If you are using any tobacco products, including cigarettes, chewing tobacco, and electronic cigarettes.  If you have any questions.  Other tests that may be performed during your second trimester include:  Blood tests that check for: ? Low iron levels (anemia). ? High blood sugar that affects pregnant women (gestational diabetes) between 71 and 28 weeks. ? Rh antibodies. This is to check for a protein on red blood cells (Rh factor).  Urine tests to check for infections, diabetes, or protein in the urine.  An ultrasound to confirm the proper growth and development of the baby.  An amniocentesis to check for possible genetic problems.  Fetal screens for spina bifida and Down syndrome.  HIV (human immunodeficiency virus) testing. Routine prenatal testing includes screening for HIV, unless you choose not to have this test.  Follow these instructions at home: Medicines  Follow your health care provider's instructions regarding medicine use. Specific medicines may be either safe or unsafe to take during pregnancy.  Take a prenatal vitamin that contains at least 600 micrograms (mcg) of folic acid.  If you develop constipation, try taking a stool softener if your health care provider approves. Eating and drinking  Eat a balanced diet that includes fresh fruits and vegetables, whole grains, good sources of protein such as meat, eggs, or tofu, and low-fat dairy. Your health care provider will help you determine the amount of weight gain that is right for you.  Avoid raw meat and uncooked cheese. These carry germs that can cause birth defects in the baby.  If you have low calcium intake from food, talk to your health care provider about whether you should take a  daily calcium supplement.  Limit foods that are high in fat and processed sugars, such as fried and sweet foods.  To prevent constipation: ? Drink enough fluid to keep your urine clear or pale yellow. ? Eat foods that are high in fiber, such as fresh fruits and vegetables, whole grains, and beans. Activity  Exercise only as directed by your health care provider. Most women can continue their usual exercise routine during pregnancy. Try to exercise for 30 minutes at least 5 days a week. Stop exercising if you experience uterine contractions.  Avoid heavy lifting, wear low heel shoes, and practice good posture.  A sexual relationship may be continued unless your health care provider directs you otherwise. Relieving pain and discomfort  Wear a good support bra to prevent discomfort from breast tenderness.  Take warm sitz baths to soothe any pain or discomfort caused by hemorrhoids. Use hemorrhoid cream if your health care provider approves.  Rest with your legs elevated if you have leg cramps or low back pain.  If you develop varicose veins, wear support hose. Elevate your feet for 15 minutes, 3-4 times a day. Limit salt in your diet. Prenatal Care  Write down your questions. Take them to your prenatal visits.  Keep all your prenatal visits as told by your health care provider. This is important. Safety  Wear your seat belt at all times when driving.  Make a list of emergency phone numbers, including numbers for family, friends, the hospital, and police and fire departments. General instructions  Ask your health care provider for a referral to a local prenatal education class. Begin classes no later than the beginning of month 6 of your pregnancy.  Ask for help if you have counseling or nutritional needs during pregnancy. Your health care provider can offer advice or refer you to specialists for help with various needs.  Do not use hot tubs, steam rooms, or saunas.  Do not  douche or use tampons or scented sanitary pads.  Do not cross your legs for long periods of time.  Avoid cat litter boxes and soil used by cats. These carry germs that can cause birth defects in the baby and possibly loss of the fetus by miscarriage or stillbirth.  Avoid all smoking, herbs, alcohol, and unprescribed drugs. Chemicals in these products can affect the formation and growth of the baby.  Do not use any products that contain nicotine or tobacco, such as cigarettes and e-cigarettes. If you need help quitting, ask your health care provider.  Visit your dentist if you have not gone yet during your pregnancy. Use a soft toothbrush to brush your teeth and be gentle when you floss. Contact a health care provider if:  You have dizziness.  You have mild pelvic cramps, pelvic pressure, or nagging pain in the abdominal area.  You have persistent nausea, vomiting, or diarrhea.  You have a bad smelling vaginal discharge.  You have pain when you urinate. Get help right away if:  You have a fever.  You are leaking fluid from your vagina.  You have spotting or bleeding from your vagina.  You have severe abdominal cramping or pain.  You have rapid weight gain or weight loss.  You have shortness of breath with chest pain.  You notice sudden or extreme swelling of your face, hands, ankles, feet, or legs.  You have not felt your baby move in over an hour.  You have severe headaches that do not go away when you take medicine.  You have vision changes. Summary  The second trimester is from week 14 through week 27 (months 4 through 6). It is also a time when the fetus is growing rapidly.  Your body goes through many changes during pregnancy. The changes vary from woman to woman.  Avoid all smoking, herbs, alcohol, and unprescribed drugs. These chemicals affect the formation and growth your baby.  Do not use any tobacco products, such as cigarettes, chewing tobacco, and  e-cigarettes. If you need help quitting, ask your health care provider.  Contact your health care provider if you have any questions. Keep all prenatal visits as told by your health care provider. This is important. This information is not intended to replace advice given to you by your health care provider. Make sure you discuss any questions you have with your health care provider. Document Released: 04/04/2001 Document Revised: 09/16/2015 Document Reviewed: 06/11/2012 Elsevier Interactive Patient Education  2017 [employer]   Sciatica Rehab Ask your health care provider which exercises are safe for  you. Do exercises exactly as told by your health care provider and adjust them as directed. It is normal to feel mild stretching, pulling, tightness, or discomfort as you do these exercises, but you should stop right away if you feel sudden pain or your pain gets worse.Do not begin these exercises until told by your health care provider. Stretching and range of motion exercises These exercises warm up your muscles and joints and improve the movement and flexibility of your hips and your back. These exercises also help to relieve pain, numbness, and tingling. Exercise A: Sciatic nerve glide 1. Sit in a chair with your head facing down toward your chest. Place your hands behind your back. Let your shoulders slump forward. 2. Slowly straighten one of your knees while you tilt your head back as if you are looking toward the ceiling. Only straighten your leg as far as you can without making your symptoms worse. 3. Hold for __________ seconds. 4. Slowly return to the starting position. 5. Repeat with your other leg. Repeat __________ times. Complete this exercise __________ times a day. Exercise B: Knee to chest with hip adduction and internal rotation  1. Lie on your back on a firm surface with both legs straight. 2. Bend one of your knees and move it up toward your chest until you feel a gentle  stretch in your lower back and buttock. Then, move your knee toward the shoulder that is on the opposite side from your leg. ? Hold your leg in this position by holding onto the front of your knee. 3. Hold for __________ seconds. 4. Slowly return to the starting position. 5. Repeat with your other leg. Repeat __________ times. Complete this exercise __________ times a day. Exercise C: Prone extension on elbows  1. Lie on your abdomen on a firm surface. A bed may be too soft for this exercise. 2. Prop yourself up on your elbows. 3. Use your arms to help lift your chest up until you feel a gentle stretch in your abdomen and your lower back. ? This will place some of your body weight on your elbows. If this is uncomfortable, try stacking pillows under your chest. ? Your hips should stay down, against the surface that you are lying on. Keep your hip and back muscles relaxed. 4. Hold for __________ seconds. 5. Slowly relax your upper body and return to the starting position. Repeat __________ times. Complete this exercise __________ times a day. Strengthening exercises These exercises build strength and endurance in your back. Endurance is the ability to use your muscles for a long time, even after they get tired. Exercise D: Pelvic tilt 1. Lie on your back on a firm surface. Bend your knees and keep your feet flat. 2. Tense your abdominal muscles. Tip your pelvis up toward the ceiling and flatten your lower back into the floor. ? To help with this exercise, you may place a small towel under your lower back and try to push your back into the towel. 3. Hold for __________ seconds. 4. Let your muscles relax completely before you repeat this exercise. Repeat __________ times. Complete this exercise __________ times a day. Exercise E: Alternating arm and leg raises  1. Get on your hands and knees on a firm surface. If you are on a hard floor, you may want to use padding to cushion your knees, such  as an exercise mat. 2. Line up your arms and legs. Your hands should be below your shoulders, and your  knees should be below your hips. 3. Lift your left leg behind you. At the same time, raise your right arm and straighten it in front of you. ? Do not lift your leg higher than your hip. ? Do not lift your arm higher than your shoulder. ? Keep your abdominal and back muscles tight. ? Keep your hips facing the ground. ? Do not arch your back. ? Keep your balance carefully, and do not hold your breath. 4. Hold for __________ seconds. 5. Slowly return to the starting position and repeat with your right leg and your left arm. Repeat __________ times. Complete this exercise __________ times a day. Posture and body mechanics  Body mechanics refers to the movements and positions of your body while you do your daily activities. Posture is part of body mechanics. Good posture and healthy body mechanics can help to relieve stress in your body's tissues and joints. Good posture means that your spine is in its natural S-curve position (your spine is neutral), your shoulders are pulled back slightly, and your head is not tipped forward. The following are general guidelines for applying improved posture and body mechanics to your everyday activities. Standing   When standing, keep your spine neutral and your feet about hip-width apart. Keep a slight bend in your knees. Your ears, shoulders, and hips should line up.  When you do a task in which you stand in one place for a long time, place one foot up on a stable object that is 2-4 inches (5-10 cm) high, such as a footstool. This helps keep your spine neutral. Sitting   When sitting, keep your spine neutral and keep your feet flat on the floor. Use a footrest, if necessary, and keep your thighs parallel to the floor. Avoid rounding your shoulders, and avoid tilting your head forward.  When working at a desk or a computer, keep your desk at a height where  your hands are slightly lower than your elbows. Slide your chair under your desk so you are close enough to maintain good posture.  When working at a computer, place your monitor at a height where you are looking straight ahead and you do not have to tilt your head forward or downward to look at the screen. Resting   When lying down and resting, avoid positions that are most painful for you.  If you have pain with activities such as sitting, bending, stooping, or squatting (flexion-based activities), lie in a position in which your body does not bend very much. For example, avoid curling up on your side with your arms and knees near your chest (fetal position).  If you have pain with activities such as standing for a long time or reaching with your arms (extension-based activities), lie with your spine in a neutral position and bend your knees slightly. Try the following positions: ? Lying on your side with a pillow between your knees. ? Lying on your back with a pillow under your knees. Lifting   When lifting objects, keep your feet at least shoulder-width apart and tighten your abdominal muscles.  Bend your knees and hips and keep your spine neutral. It is important to lift using the strength of your legs, not your back. Do not lock your knees straight out.  Always ask for help to lift heavy or awkward objects. This information is not intended to replace advice given to you by your health care provider. Make sure you discuss any questions you have with your health  care provider. Document Released: 04/10/2005 Document Revised: 12/16/2015 Document Reviewed: 12/25/2014 Elsevier Interactive Patient Education  [employer].

## 2017-08-23 NOTE — Progress Notes (Signed)
   HIGH-RISK PREGNANCY VISIT Patient name: Mariah [name] MRN [id number]  Date of birth: August 05, 1975 Chief Complaint:   Routine Prenatal Visit (2nd IT)  History of Present Illness:   Mariah [name] is a 42 y.o. G13P0030 female at [redacted]w[redacted]d with an Estimated Date of Delivery: 02/07/18 being seen today for ongoing management of a high-risk pregnancy complicated by AMA.  Today she reports sciatica pain, occ headache/migraine.  . Vag. Bleeding: None.  Movement: Absent. denies leaking of fluid.  Review of Systems:   Pertinent items are noted in HPI Denies abnormal vaginal discharge w/ itching/odor/irritation, headaches, visual changes, shortness of breath, chest pain, abdominal pain, severe nausea/vomiting, or problems with urination or bowel movements unless otherwise stated above. Pertinent History Reviewed:  Reviewed past medical,surgical, social, obstetrical and family history.  Reviewed problem list, medications and allergies. Physical Assessment:   Vitals:   08/23/17 0935  BP: 120/72  Pulse: 99  Weight: 156 lb (70.8 kg)  Body mass index is 23.04 kg/m.           Physical Examination:   General appearance: alert, well appearing, and in no distress  Mental status: alert, oriented to person, place, and time  Skin: warm & dry   Extremities: Edema: None    Cardiovascular: normal heart rate noted  Respiratory: normal respiratory effort, no distress  Abdomen: gravid, soft, non-tender  Pelvic: Cervical exam deferred         Fetal Status: Fetal Heart Rate (bpm): 150   Movement: Absent    Fetal Surveillance Testing today: doppler   Results for orders placed or performed in visit on 08/23/17 (from the past 24 hour(s))  POCT urinalysis dipstick   Collection Time: 08/23/17  9:36 AM  Result Value Ref Range   Color, UA     Clarity, UA     Glucose, UA neg    Bilirubin, UA     Ketones, UA neg    Spec Grav, UA  1.010 - 1.025   Blood, UA neg    pH, UA  5.0 - 8.0   Protein, UA neg    Urobilinogen, UA  0.2 or 1.0 E.U./dL   Nitrite, UA neg    Leukocytes, UA Negative Negative   Appearance     Odor      Assessment & Plan:  1) High-risk pregnancy G4P0030 at [redacted]w[redacted]d with an Estimated Date of Delivery: 02/07/18   2) AMA, continue baby asa, 2nd IT today  3) Sciatica, gave printed exercises  4) Occ headache/migraine> gave printed prevention/relief measures   Meds: No orders of the defined types were placed in this encounter.   Labs/procedures today: 2nd IT  Treatment Plan:  U/S @ 20, 24, 28, 32, 36wks    2x/wk testing nst/sono @ 36wks    Deliver @ 40wks  Reviewed: Preterm labor symptoms and general obstetric precautions including but not limited to vaginal bleeding, contractions, leaking of fluid and fetal movement were reviewed in detail with the patient.  All questions were answered.  Follow-up: Return in about 2 weeks (around 09/06/2017) for HROB, [id number].  Orders Placed This Encounter  Procedures  . GC/Chlamydia Probe Amp  . US OB Comp + 14 Wk  . INTEGRATED 2  . POCT urinalysis dipstick   Cheral Marker CNM, [facility] 08/23/2017 1:02 PM

## 2017-08-25 LAB — GC/CHLAMYDIA PROBE AMP
Chlamydia trachomatis, NAA: NEGATIVE
Neisseria gonorrhoeae by PCR: NEGATIVE

## 2017-08-26 LAB — INTEGRATED 2
AFP MoM: 0.99
Alpha-Fetoprotein: 32.9 ng/mL
Crown Rump Length: 77.5 mm
DIA MOM: 1.74
DIA VALUE: 297.2 pg/mL
Estriol, Unconjugated: 0.98 ng/mL
Gest. Age on Collection Date: 13.6 weeks
Gestational Age: 16.6 weeks
HCG MOM: 2.65
Maternal Age at EDD: 42.7 yr
NUCHAL TRANSLUCENCY (NT): 1.4 mm
NUCHAL TRANSLUCENCY MOM: 0.77
Number of Fetuses: 1
PAPP-A MoM: 1.07
PAPP-A Value: 1592.2 ng/mL
Test Results:: NEGATIVE
WEIGHT: 145 [lb_av]
Weight: 145 [lb_av]
hCG Value: 81.5 IU/mL
uE3 MoM: 1.09

## 2017-09-06 ENCOUNTER — Encounter: Payer: Self-pay | Admitting: Women's Health

## 2017-09-06 ENCOUNTER — Ambulatory Visit (INDEPENDENT_AMBULATORY_CARE_PROVIDER_SITE_OTHER): Payer: Medicaid Other | Admitting: Obstetrics & Gynecology

## 2017-09-06 ENCOUNTER — Ambulatory Visit (INDEPENDENT_AMBULATORY_CARE_PROVIDER_SITE_OTHER): Payer: Medicaid Other

## 2017-09-06 ENCOUNTER — Encounter: Payer: Self-pay | Admitting: Obstetrics & Gynecology

## 2017-09-06 VITALS — BP 118/66 | HR 90 | Wt 163.0 lb

## 2017-09-06 DIAGNOSIS — O09522 Supervision of elderly multigravida, second trimester: Secondary | ICD-10-CM

## 2017-09-06 DIAGNOSIS — O4440 Low lying placenta NOS or without hemorrhage, unspecified trimester: Secondary | ICD-10-CM | POA: Insufficient documentation

## 2017-09-06 DIAGNOSIS — O0990 Supervision of high risk pregnancy, unspecified, unspecified trimester: Secondary | ICD-10-CM

## 2017-09-06 DIAGNOSIS — Z1389 Encounter for screening for other disorder: Secondary | ICD-10-CM

## 2017-09-06 DIAGNOSIS — Z3A18 18 weeks gestation of pregnancy: Secondary | ICD-10-CM

## 2017-09-06 DIAGNOSIS — Z363 Encounter for antenatal screening for malformations: Secondary | ICD-10-CM | POA: Diagnosis not present

## 2017-09-06 DIAGNOSIS — Z331 Pregnant state, incidental: Secondary | ICD-10-CM

## 2017-09-06 DIAGNOSIS — O0992 Supervision of high risk pregnancy, unspecified, second trimester: Secondary | ICD-10-CM

## 2017-09-06 LAB — POCT URINALYSIS DIPSTICK
Glucose, UA: NEGATIVE
KETONES UA: NEGATIVE
Leukocytes, UA: NEGATIVE
NITRITE UA: NEGATIVE
PROTEIN UA: NEGATIVE
RBC UA: NEGATIVE

## 2017-09-06 NOTE — Progress Notes (Signed)
   LOW-RISK PREGNANCY VISIT Patient name: Mariah [name] MRN [id number]  Date of birth: 1975-08-18 Chief Complaint:   Routine Prenatal Visit  History of Present Illness:   MAHEEN [name] is a 42 y.o. G37P0030 female at [redacted]w[redacted]d with an Estimated Date of Delivery: 02/07/18 being seen today for ongoing management of a low-risk pregnancy.  Today she reports no complaints.  . Vag. Bleeding: None.  Movement: Present. denies leaking of fluid. Review of Systems:   Pertinent items are noted in HPI Denies abnormal vaginal discharge w/ itching/odor/irritation, headaches, visual changes, shortness of breath, chest pain, abdominal pain, severe nausea/vomiting, or problems with urination or bowel movements unless otherwise stated above. Pertinent History Reviewed:  Reviewed past medical,surgical, social, obstetrical and family history.  Reviewed problem list, medications and allergies. Physical Assessment:   Vitals:   09/06/17 1014  BP: 118/66  Pulse: 90  Weight: 163 lb (73.9 kg)  Body mass index is 24.07 kg/m.        Physical Examination:   General appearance: Well appearing, and in no distress  Mental status: Alert, oriented to person, place, and time  Skin: Warm & dry  Cardiovascular: Normal heart rate noted  Respiratory: Normal respiratory effort, no distress  Abdomen: Soft, gravid, nontender  Pelvic: Cervical exam deferred         Extremities: Edema: None  Fetal Status: Fetal Heart Rate (bpm): 155   Movement: Present    Results for orders placed or performed in visit on 09/06/17 (from the past 24 hour(s))  POCT Urinalysis Dipstick   Collection Time: 09/06/17 10:16 AM  Result Value Ref Range   Color, UA     Clarity, UA     Glucose, UA neg    Bilirubin, UA     Ketones, UA neg    Spec Grav, UA  1.010 - 1.025   Blood, UA neg    pH, UA  5.0 - 8.0   Protein, UA neg    Urobilinogen, UA  0.2 or 1.0 E.U./dL   Nitrite, UA neg    Leukocytes, UA Negative Negative   Appearance     Odor      Assessment & Plan:  1) Low-risk pregnancy G4P0030 at [redacted]w[redacted]d with an Estimated Date of Delivery: 02/07/18   2) AMA, normal evaluations   Meds: No orders of the defined types were placed in this encounter.  Labs/procedures today: sonogram is normal  Plan:  Continue routine obstetrical care   Reviewed: Preterm labor symptoms and general obstetric precautions including but not limited to vaginal bleeding, contractions, leaking of fluid and fetal movement were reviewed in detail with the patient.  All questions were answered  Follow-up: Return in about 1 month (around 10/04/2017) for LROB.  Orders Placed This Encounter  Procedures  . POCT Urinalysis Dipstick   Lazaro Arms  09/06/2017 10:34 AM

## 2017-09-06 NOTE — Progress Notes (Signed)
[place] 18 wks,breech,low lying anterior pl gr 0,tip of placenta to cx  9 mm,cx length 4.5 cm,normal ovaries bilat,svp of fluid 4.3 cm,fhr 148 bpm,EFW 249 g 81%,anatomy complete

## 2017-10-04 ENCOUNTER — Encounter: Payer: Self-pay | Admitting: Advanced Practice Midwife

## 2017-10-04 ENCOUNTER — Ambulatory Visit (INDEPENDENT_AMBULATORY_CARE_PROVIDER_SITE_OTHER): Payer: Medicaid Other | Admitting: Advanced Practice Midwife

## 2017-10-04 VITALS — BP 114/71 | HR 108 | Wt 174.0 lb

## 2017-10-04 DIAGNOSIS — Z331 Pregnant state, incidental: Secondary | ICD-10-CM

## 2017-10-04 DIAGNOSIS — R51 Headache: Secondary | ICD-10-CM

## 2017-10-04 DIAGNOSIS — O09522 Supervision of elderly multigravida, second trimester: Secondary | ICD-10-CM

## 2017-10-04 DIAGNOSIS — O9989 Other specified diseases and conditions complicating pregnancy, childbirth and the puerperium: Secondary | ICD-10-CM

## 2017-10-04 DIAGNOSIS — Z3A22 22 weeks gestation of pregnancy: Secondary | ICD-10-CM

## 2017-10-04 DIAGNOSIS — M545 Low back pain: Secondary | ICD-10-CM

## 2017-10-04 DIAGNOSIS — Z1389 Encounter for screening for other disorder: Secondary | ICD-10-CM

## 2017-10-04 DIAGNOSIS — O4440 Low lying placenta NOS or without hemorrhage, unspecified trimester: Secondary | ICD-10-CM

## 2017-10-04 DIAGNOSIS — O0992 Supervision of high risk pregnancy, unspecified, second trimester: Secondary | ICD-10-CM

## 2017-10-04 LAB — POCT URINALYSIS DIPSTICK
Glucose, UA: NEGATIVE
KETONES UA: NEGATIVE
Leukocytes, UA: NEGATIVE
NITRITE UA: NEGATIVE
PROTEIN UA: NEGATIVE
RBC UA: NEGATIVE

## 2017-10-04 MED ORDER — BUTALBITAL-APAP-CAFFEINE 50-325-40 MG PO TABS: 1.0000 | ORAL_TABLET | Freq: Four times a day (QID) | ORAL | 0 refills | Status: DC | PRN

## 2017-10-04 NOTE — Progress Notes (Signed)
HIGH-RISK PREGNANCY VISIT Patient name: Mariah [name] MRN [id number]  Date of birth: 11/28/1975 Chief Complaint:   high risk ob  History of Present Illness:   Mariah [name] is a 42 y.o. 144P0030 female at 5731w0d with an Estimated Date of Delivery: 02/07/18 being seen today for ongoing management of a high-risk pregnancy complicated by AMA.  Today she reports backache, LB, only since being pregnant. Also has been having HA, caffeine helps for a little while.  Contractions: Not present.  .  Movement: Present. denies leaking of fluid.  Review of Systems:   Pertinent items are noted in HPI Denies abnormal vaginal discharge w/ itching/odor/irritation, headaches, visual changes, shortness of breath, chest pain, abdominal pain, severe nausea/vomiting, or problems with urination or bowel movements unless otherwise stated above.    Pertinent History Reviewed:  Medical & Surgical Hx:   Past Medical History:  Diagnosis Date  . Anxiety   . Chronic chest wall pain   . Headache    migraines  . Vertigo    Past Surgical History:  Procedure Laterality Date  . KNEE ARTHROSCOPY    . WISDOM TOOTH EXTRACTION     Family History  Problem Relation Age of Onset  . Heart disease Paternal Grandfather   . Heart attack Paternal Grandfather   . Diabetes Paternal Grandmother   . Hypertension Paternal Grandmother   . Asthma Paternal Grandmother   . Cancer Paternal Grandmother        SKIN   . Atrial fibrillation Paternal Grandmother        PACEMAKER  . Hypertension Father   . COPD Father   . Heart attack Father   . Diabetes Mother   . Hypertension Mother   . Mental illness Mother        bipolar  . Cancer Brother        skin  . Hypertension Brother   . Cancer Paternal Aunt        brain   . Cancer Paternal Uncle        bone    Current Outpatient Medications:  .  acetaminophen (TYLENOL) 500 MG tablet, Take 500 mg by mouth every 6 (six) hours as needed., Disp: , Rfl:  .  aspirin EC 81 MG  tablet, Take 2 tablets (162 mg total) by mouth daily., Disp: 60 tablet, Rfl: 6 .  cetirizine (ZYRTEC) 10 MG tablet, Take 10 mg by mouth daily., Disp: , Rfl:  .  prenatal vitamin w/FE, FA (PRENATAL 1 + 1) 27-1 MG TABS tablet, Take 1 tablet by mouth daily at 12 noon., Disp: 30 each, Rfl: 12 .  HYDROcodone-acetaminophen (NORCO/VICODIN) 5-325 MG tablet, Take 1 tablet by mouth every 6 (six) hours as needed for moderate pain. (Patient not taking: Reported on 08/23/2017), Disp: 3 tablet, Rfl: 0 .  ondansetron (ZOFRAN ODT) 4 MG disintegrating tablet, 4mg  ODT q4 hours prn nausea/vomit (Patient not taking: Reported on 08/23/2017), Disp: 4 tablet, Rfl: 0 Social History: Reviewed -  reports that she quit smoking about 3 years ago. Her smoking use included cigarettes. She has never used smokeless tobacco.   Physical Assessment:   Vitals:   10/04/17 0853  BP: 114/71  Pulse: (!) 108  Weight: 174 lb (78.9 kg)  Body mass index is 25.7 kg/m.           Physical Examination:   General appearance: alert, well appearing, and in no distress  Mental status: alert, oriented to person, place, and time  Skin: warm & dry  Extremities: Edema: None    Cardiovascular: normal heart rate noted  Respiratory: normal respiratory effort, no distress  Abdomen: gravid, soft, non-tender  Pelvic: Cervical exam deferred         Fetal Status:     Movement: Present    Fetal Surveillance Testing today: doppler  Results for orders placed or performed in visit on 10/04/17 (from the past 24 hour(s))  POCT urinalysis dipstick   Collection Time: 10/04/17  9:03 AM  Result Value Ref Range   Color, UA     Clarity, UA     Glucose, UA Negative Negative   Bilirubin, UA     Ketones, UA neg    Spec Grav, UA  1.010 - 1.025   Blood, UA neg    pH, UA  5.0 - 8.0   Protein, UA Negative Negative   Urobilinogen, UA  0.2 or 1.0 E.U./dL   Nitrite, UA neg    Leukocytes, UA Negative Negative   Appearance     Odor      Assessment & Plan:   1) High-risk pregnancy G4P0030 at [redacted]w[redacted]d with an Estimated Date of Delivery: 02/07/18   2) AMA,   3) low lying placenta, recheck w/US 2 weeks; pelvic rest  Labs/procedures today:   Medications: asa 162mg   foricet PRN  Treatment Plan:  Growth [place] q 4 weeks, IOL 40 weeks  Follow-up: Return in about 2 weeks (around 10/18/2017) for HROB, US:EFW.  Orders Placed This Encounter  Procedures  . US OB Follow Up  . POCT urinalysis dipstick   Jacklyn Shell CNM 10/04/2017 9:19 AM

## 2017-10-19 ENCOUNTER — Ambulatory Visit (INDEPENDENT_AMBULATORY_CARE_PROVIDER_SITE_OTHER): Payer: Medicaid Other

## 2017-10-19 ENCOUNTER — Ambulatory Visit (INDEPENDENT_AMBULATORY_CARE_PROVIDER_SITE_OTHER): Payer: Medicaid Other | Admitting: Obstetrics and Gynecology

## 2017-10-19 ENCOUNTER — Encounter: Payer: Self-pay | Admitting: Obstetrics and Gynecology

## 2017-10-19 VITALS — BP 123/72 | HR 103 | Wt 180.6 lb

## 2017-10-19 DIAGNOSIS — Z331 Pregnant state, incidental: Secondary | ICD-10-CM

## 2017-10-19 DIAGNOSIS — O0990 Supervision of high risk pregnancy, unspecified, unspecified trimester: Secondary | ICD-10-CM

## 2017-10-19 DIAGNOSIS — O09522 Supervision of elderly multigravida, second trimester: Secondary | ICD-10-CM | POA: Diagnosis not present

## 2017-10-19 DIAGNOSIS — O0992 Supervision of high risk pregnancy, unspecified, second trimester: Secondary | ICD-10-CM | POA: Diagnosis not present

## 2017-10-19 DIAGNOSIS — O4440 Low lying placenta NOS or without hemorrhage, unspecified trimester: Secondary | ICD-10-CM | POA: Diagnosis not present

## 2017-10-19 DIAGNOSIS — Z3A24 24 weeks gestation of pregnancy: Secondary | ICD-10-CM

## 2017-10-19 DIAGNOSIS — Z1389 Encounter for screening for other disorder: Secondary | ICD-10-CM

## 2017-10-19 LAB — POCT URINALYSIS DIPSTICK
Glucose, UA: NEGATIVE
Ketones, UA: NEGATIVE
Leukocytes, UA: NEGATIVE
NITRITE UA: NEGATIVE
PROTEIN UA: NEGATIVE
RBC UA: NEGATIVE

## 2017-10-19 NOTE — Progress Notes (Signed)
Patient ID: Mariah [name], female   DOB: 02/12/1976, 42 y.o.   MRN: [id number]    [facility] Patient name: Mariah Apaamara V Marlatt MRN [id number]  Date of birth: 12/09/1975 Chief Complaint:   High Risk Gestation (US today; low back pain; sciatic nerve)  History of Present Illness:   Mariah Apaamara V Montenegro is a 42 y.o. 634P0030 female at 8176w1d with an Estimated Date of Delivery: 02/07/18 being seen today for ongoing management of a high-risk pregnancy complicated by AMA.  Today she reports slight soreness on her abdomen. She reports staying healthy but she is a little uncomfortable. She plans on breast feeding and has not signed up for any classes yet.     Contractions: Irregular. Vag. Bleeding: None.  Movement: Present. denies leaking of fluid.  Review of Systems:   Pertinent items are noted in HPI Denies abnormal vaginal discharge w/ itching/odor/irritation, headaches, visual changes, shortness of breath, chest pain, abdominal pain, severe nausea/vomiting, or problems with urination or bowel movements unless otherwise stated above. Pertinent History Reviewed:  Reviewed past medical,surgical, social, obstetrical and family history.  Reviewed problem list, medications and allergies. Physical Assessment:   Vitals:   10/19/17 0925  BP: 123/72  Pulse: (!) 103  Weight: 180 lb 9.6 oz (81.9 kg)  Body mass index is 26.67 kg/m.           Physical Examination:   General appearance: alert, well appearing, and in no distress and oriented to person, place, and time  Mental status: alert, oriented to person, place, and time, normal mood, behavior, speech, dress, motor activity, and thought processes, affect appropriate to mood  Skin: warm & dry   Extremities: Edema: Trace    Cardiovascular: normal heart rate noted  Respiratory: normal respiratory effort, no distress  Abdomen: gravid, soft, non-tender  Pelvic: Cervical exam deferred         Fetal Status: Fetal Heart Rate (bpm): 129 Fundal  Height: 26 cm Movement: Present    Fetal Surveillance Testing today: US 24+1 wks,breech,resolved low lying placenta 4 cm from cervix,anterior placenta,cx 4.2 cm,normal ovaries bilat,fhr 129 bpm,afi 14 cm,EFW 752 g 77%  Results for orders placed or performed in visit on 10/19/17 (from the past 24 hour(s))  POCT urinalysis dipstick   Collection Time: 10/19/17  9:21 AM  Result Value Ref Range   Color, UA     Clarity, UA     Glucose, UA Negative Negative   Bilirubin, UA     Ketones, UA neg    Spec Grav, UA  1.010 - 1.025   Blood, UA neg    pH, UA  5.0 - 8.0   Protein, UA Negative Negative   Urobilinogen, UA  0.2 or 1.0 E.U./dL   Nitrite, UA neg    Leukocytes, UA Negative Negative   Appearance     Odor      Assessment & Plan:  1) High-risk pregnancy G4P0030 at 5776w1d with an Estimated Date of Delivery: 02/07/18   2) AMA, stable routine pnc for now, growth u/s in 4 wk  Meds: No orders of the defined types were placed in this encounter.  Labs/procedures today: US growth at 77%ile  Treatment Plan:   1) F/U in 4 weeks  Follow-up: No follow-ups on file.  Orders Placed This Encounter  Procedures  . POCT urinalysis dipstick   By signing my name below, I, Pietro CassisEmily Tufford, attest that this documentation has been prepared under the direction and in the presence of Tilda BurrowFerguson, Avielle Imbert V, MD.  Electronically Signed: Pietro Cassis, Medical Scribe. 10/19/17. 9:53 AM.  I personally performed the services described in this documentation, which was SCRIBED in my presence. The recorded information has been reviewed and considered accurate. It has been edited as necessary during review. Tilda Burrow, MD

## 2017-10-19 NOTE — Patient Instructions (Signed)
CHILDBIRTH CLASSES ° °[facility] °Call to Register: [phone] or [phone] or Register Online: [email or web address] ° °THESE CLASSES FILL UP VERY QUICKLY, SO SIGN UP AS SOON AS YOU CAN!!! ° °*Please visit [facility] pregnancy website at [email or web address]* ° °Option 1: Birth & Baby Series °• This series of 3 weekly classes helps you and your labor partner prepare for childbirth at [facility]. °• Reviews newborn care, labor & birth, pain management, and comfort techniques °• Maternity Care Center Tour of [facility] is included. °• Cost: $60 per couple for insured or self-pay, $30 per couple for Medicaid ° °Option 2: Weekend Birth & Baby °• This class is a weekend version of our Birth & Baby series. It is designed for parents who have a difficult time fitting several weeks of classes into their schedule.  °• Maternity Care Center Tour of [facility] is included.  °• Friday 6:30pm-8:30pm, Saturday 9am-4pm °• Cost: $75 per couple for insured or self-pay, $30 per couple for Medicaid ° °Option 3: Natural Childbirth °• This series of 5 weekly classes is for expectant parents who want to learn and practice natural methods of coping with the process of labor and childbirth. °• Maternity Care Center Tour of [facility] is included. °• Cost: $75 per couple for insured or self-pay, $30 per couple for Medicaid ° °Option 4: Online Birth & Baby °• This online class offers you the freedom to complete a Birth & Baby series in the comfort of your own home. The flexibility of this option allows you to review sections at your own place, at times convenient to you and your support people.  °• Cost: $60 for 60 days of online access ° ° ° °Other Available Classes °Baby & Me °Enjoy this time to discuss newborn & infant parenting topics and family adjustment issues with other new mothers in a relaxed environment. Each week brings a new speaker or baby-centered activity. We encourage  mothers and their babies (birth to crawling) to join us every Thursday in the [facility] at 11:00 am. You are welcome to visit this group even if you haven't delivered yet! It's wonderful to make new friends early and watch other moms interact with their babies. No registration or fee.  ° °Big Brother/ Big Sister °Let your children share in the joy of a new brother or sister in this special class designed just for them. This class is designed for children ages 2-6, but any age is welcome. Please register each child individually. ° °Breastfeeding Support Group °This group is a mother-to-mother support circle where moms have the opportunity to share their breastfeeding experiences. A breastfeeding Support nurse is present for questions and concerns. Meets each Tuesday at 11:00 am. No fee or registration. ° °Breastfeeding Your Baby °Breastfeeding is a special time for mother and child. This class will help you feel ready to begin this important relationship. Your partner is encouraged to attend with you.  ° °Caring For Baby °This class is for expectant  and adoptive parents who want to learn and practice the most up-to-date newborn care for their babies. Register only the mom-to-be and your partner can come with you. (Note: This class is included in the Birth & Baby series and the Weekend Birth & Baby classes.) ° °Comfort Techniques & Tour °This 2-hour interactive class will provide you the opportunity to learn & practice hands-on techniques with your partner that can help relieve some discomfort of labor and encourage your baby to rotate toward   the best position for birth. A tour of the [facility] is included.  ° °Daddy Boot Camp °This course offers Dads-to-be the tools and knowledge needed to feel confident on their journey to becoming new fathers. ° °Grandparent Love °Expecting a grandbaby? Learn about the latest infant care and safety recommendation and ways to  support your own child as he or she transitions into the parenting role.  ° °Infant and Child CPR °Parents, grandparents, babysitters, and friends learn Cardio-Pulmonary Resuscitation skills for infants and children. Register each participant individually. (Note: This Family & Friends program does not offer certification.) ° °Marvelous Multiples °Expecting twins, triplets, or more? This class covers the differences in labor, birth, parenting, and breastfeeding issues that face multiples' parents. NICU tour is included.  ° °Mom Talk °This mom-led group offers support and connection to mothers as they journey through the adjustments and struggles of that sometimes overwhelming first year after the birth of a child. A member of our [facility] staff will be present to share resources and additional support if needed, as you care for yourself and baby. You are welcome to visit the group before you deliver! It's wonderful to meet new friends early and watch other moms interact with their babies. It's held at [facility] at 10:00 am each Tuesday morning and 6:00 pm each Thursday evening. Babies (birth to crawling) welcome. No registration or fee.  ° °Waterbirth Classes °Interested in a waterbirth? This informational class will help you discover whether waterbirth is the right fir for you.  ° °[facility] Virtual Maternity Tour °View a virtual tour of [facility]. In-person tours are available for participants of childbirth education classes.  ° °

## 2017-10-19 NOTE — Progress Notes (Signed)
US 24+1 wks,breech,resolved low lying placenta 4 cm from cervix,anterior placenta,cx 4.2 cm,normal ovaries bilat,fhr 129 bpm,afi 14 cm,EFW 752 g 77%

## 2017-11-12 ENCOUNTER — Telehealth: Payer: Self-pay | Admitting: *Deleted

## 2017-11-12 NOTE — Telephone Encounter (Signed)
Patient states she has not had a BM since Friday.  She is taking 3 stool softners a day along with increasing her dietary fiber intake with no results. Advised patient to try Miralax, Benefiber or Metamucil along with warming liquids.  Stated she would try.

## 2017-11-16 ENCOUNTER — Encounter: Payer: Medicaid Other | Admitting: Obstetrics and Gynecology

## 2017-11-21 ENCOUNTER — Ambulatory Visit (INDEPENDENT_AMBULATORY_CARE_PROVIDER_SITE_OTHER): Payer: Medicaid Other | Admitting: Obstetrics and Gynecology

## 2017-11-21 VITALS — BP 124/71 | HR 100 | Wt 194.4 lb

## 2017-11-21 DIAGNOSIS — Z3A28 28 weeks gestation of pregnancy: Secondary | ICD-10-CM

## 2017-11-21 DIAGNOSIS — Z1389 Encounter for screening for other disorder: Secondary | ICD-10-CM

## 2017-11-21 DIAGNOSIS — O09523 Supervision of elderly multigravida, third trimester: Secondary | ICD-10-CM

## 2017-11-21 DIAGNOSIS — O0990 Supervision of high risk pregnancy, unspecified, unspecified trimester: Secondary | ICD-10-CM

## 2017-11-21 DIAGNOSIS — Z331 Pregnant state, incidental: Secondary | ICD-10-CM

## 2017-11-21 LAB — POCT URINALYSIS DIPSTICK OB
Blood, UA: NEGATIVE
Glucose, UA: NEGATIVE — AB
Ketones, UA: NEGATIVE
Leukocytes, UA: NEGATIVE
NITRITE UA: NEGATIVE
POC,PROTEIN,UA: NEGATIVE

## 2017-11-21 NOTE — Progress Notes (Signed)
Patient ID: Mariah [name], female   DOB: 02/03/1976, 42 y.o.   MRN: [id number]    [facility] Patient name: Mariah [name] MRN [id number]  Date of birth: 04/05/1976 Chief Complaint:   Routine Prenatal Visit  History of Present Illness:   Mariah [name] is a 42 y.o. 414P0030 female at 5359w6d with an Estimated Date of Delivery: 02/07/18 being seen today for ongoing management of a high-risk pregnancy complicated by AMA.  Today she reports no complaints. Contractions: Irritability. Vag. Bleeding: None.  Movement: Present. denies leaking of fluid.  Review of Systems:   Pertinent items are noted in HPI Denies abnormal vaginal discharge w/ itching/odor/irritation, headaches, visual changes, shortness of breath, chest pain, abdominal pain, severe nausea/vomiting, or problems with urination or bowel movements unless otherwise stated above. Pertinent History Reviewed:  Reviewed past medical,surgical, social, obstetrical and family history.  Reviewed problem list, medications and allergies. Physical Assessment:   Vitals:   11/21/17 0945  BP: 124/71  Pulse: 100  Weight: 194 lb 6.4 oz (88.2 kg)  Body mass index is 28.71 kg/m.           Physical Examination:   General appearance: alert, well appearing, and in no distress and oriented to person, place, and time  Mental status: alert, oriented to person, place, and time, normal mood, behavior, speech, dress, motor activity, and thought processes, affect appropriate to mood  Skin: warm & dry   Extremities: Edema: Trace    Cardiovascular: normal heart rate noted  Respiratory: normal respiratory effort, no distress  Abdomen: gravid, soft, non-tender  Pelvic: Cervical exam deferred         Fetal Status: Fetal Heart Rate (bpm): 169 Fundal Height: 31 cm Movement: Present    Fetal Surveillance Testing today: None  Results for orders placed or performed in visit on 11/21/17 (from the past 24 hour(s))  POC Urinalysis Dipstick OB   Collection Time: 11/21/17  9:47 AM  Result Value Ref Range   Color, UA     Clarity, UA     Glucose, UA Negative (A) (none)   Bilirubin, UA     Ketones, UA neg    Spec Grav, UA  1.010 - 1.025   Blood, UA neg    pH, UA  5.0 - 8.0   POC Protein UA Negative Negative, Trace   Urobilinogen, UA  0.2 or 1.0 E.U./dL   Nitrite, UA neg    Leukocytes, UA Negative Negative   Appearance     Odor      Assessment & Plan:  1) High-risk pregnancy G4P0030 at 5159w6d with an Estimated Date of Delivery: 02/07/18   2) AMA, stable   Meds: No orders of the defined types were placed in this encounter.   Labs/procedures today: None  Treatment Plan:   1. Take multivitamin with iron 2. Continue taking baby aspirin 3. F/u for Glucose tolerance test  Follow-up: 2 weeks for glucose tolerance testing and PN2   Orders Placed This Encounter  Procedures  . POC Urinalysis Dipstick OB   By signing my name below, I, Arnette NorrisMari Johnson, attest that this documentation has been prepared under the direction and in the presence of Tilda BurrowFerguson, Jozef Eisenbeis V, MD. Electronically Signed: Arnette NorrisMari Johnson Medical Scribe. 11/21/17. 10:36 AM.  .ljfs

## 2017-11-22 ENCOUNTER — Telehealth: Payer: Self-pay | Admitting: Obstetrics & Gynecology

## 2017-11-22 NOTE — Telephone Encounter (Signed)
Patient states she is using Tucks pads and cream but is still having issues with her hemorrhoids. Say she is just wiping the hemorrhoids with the pads.  Advised to try leaving the Tucks "tucked" next to the hemorrhoid and see if that helps.  If not to let us know.  Verbalized understanding.

## 2017-11-23 ENCOUNTER — Other Ambulatory Visit: Payer: Medicaid Other

## 2017-11-23 DIAGNOSIS — Z131 Encounter for screening for diabetes mellitus: Secondary | ICD-10-CM

## 2017-11-23 DIAGNOSIS — Z3A29 29 weeks gestation of pregnancy: Secondary | ICD-10-CM

## 2017-11-23 DIAGNOSIS — Z3483 Encounter for supervision of other normal pregnancy, third trimester: Secondary | ICD-10-CM

## 2017-11-24 ENCOUNTER — Emergency Department (HOSPITAL_COMMUNITY)
Admission: EM | Admit: 2017-11-24 | Discharge: 2017-11-24 | Disposition: A | Discharge status: Home / Self Care | Payer: Medicaid Other | Attending: Emergency Medicine | Admitting: Emergency Medicine

## 2017-11-24 ENCOUNTER — Other Ambulatory Visit: Payer: Self-pay

## 2017-11-24 ENCOUNTER — Encounter (HOSPITAL_COMMUNITY): Payer: Self-pay | Admitting: Emergency Medicine

## 2017-11-24 DIAGNOSIS — Z7982 Long term (current) use of aspirin: Secondary | ICD-10-CM | POA: Insufficient documentation

## 2017-11-24 DIAGNOSIS — Z87891 Personal history of nicotine dependence: Secondary | ICD-10-CM | POA: Insufficient documentation

## 2017-11-24 DIAGNOSIS — Z3A28 28 weeks gestation of pregnancy: Secondary | ICD-10-CM | POA: Diagnosis not present

## 2017-11-24 DIAGNOSIS — Z79899 Other long term (current) drug therapy: Secondary | ICD-10-CM | POA: Insufficient documentation

## 2017-11-24 DIAGNOSIS — O2243 Hemorrhoids in pregnancy, third trimester: Secondary | ICD-10-CM | POA: Insufficient documentation

## 2017-11-24 LAB — ANTIBODY SCREEN: Antibody Screen: NEGATIVE

## 2017-11-24 LAB — GLUCOSE TOLERANCE, 2 HOURS W/ 1HR
GLUCOSE, FASTING: 79 mg/dL (ref 65–91)
Glucose, 1 hour: 159 mg/dL (ref 65–179)
Glucose, 2 hour: 127 mg/dL (ref 65–152)

## 2017-11-24 LAB — CBC
Hematocrit: 35.6 % (ref 34.0–46.6)
Hemoglobin: 11.9 g/dL (ref 11.1–15.9)
MCH: 30.3 pg (ref 26.6–33.0)
MCHC: 33.4 g/dL (ref 31.5–35.7)
MCV: 91 fL (ref 79–97)
Platelets: 242 x10E3/uL (ref 150–450)
RBC: 3.93 x10E6/uL (ref 3.77–5.28)
RDW: 13 % (ref 12.3–15.4)
WBC: 10.6 x10E3/uL (ref 3.4–10.8)

## 2017-11-24 LAB — RPR: RPR: NONREACTIVE

## 2017-11-24 LAB — HIV ANTIBODY (ROUTINE TESTING W REFLEX): HIV Screen 4th Generation wRfx: NONREACTIVE

## 2017-11-24 MED ORDER — HYDROCORTISONE ACETATE 25 MG RE SUPP: 25.0000 mg | Freq: Two times a day (BID) | RECTAL | 0 refills | Status: DC

## 2017-11-24 NOTE — Progress Notes (Signed)
Spoke with Dr. Jolayne Pantheronstant. Pt is a G4P0 at 29 2/[redacted] weeks gestation presenting with c/o hemorrhoids that are bleeding and have turned black. [facility] No vaginal bleeding or leaking of fluid. FHR tracing is a category 1 with no uc's. Pt is OB cleared.

## 2017-11-24 NOTE — ED Triage Notes (Signed)
Patient c/o hemorrhoids since Wednesday. Patient reports using tucks, colace, witch hazel, and preparation H, Epson salt. Patient reports some bright red rectal bleeding. No BM since Wednesday. Patient is [redacted] weeks pregnant, first pregnancy, Dr Despina HiddenEure. Patient states gestational diabetes.

## 2017-11-24 NOTE — Progress Notes (Signed)
Received call from [facility] RN. Pt is 29 2/[redacted] weeks gestation presenting with c/o hemorrhoids that are bleeding and turning black. No vaginal bleeding or leaking of fluid. [facility] [place]

## 2017-11-24 NOTE — ED Notes (Signed)
ED Provider at bedside. 

## 2017-11-24 NOTE — ED Notes (Signed)
OB Rapid Response RN notified that pt was placed on external monitor and given pt's information and compliant.

## 2017-11-24 NOTE — Discharge Instructions (Addendum)
Please increase the number of sitz bath that she use.  Please increase water.  Increase the fiber in your diet.  Jell-O chocolate pudding may also be helpful with keeping your stools soft and regular.  Use Tylenol extra strength every 4 hours for the next 3 days, then every 4 hours on an as-needed basis.  Please use the Anusol HC suppositories 2 or 3 times daily.  Please discuss this hemorrhoid problem with your [facility] for any additional suggestions.

## 2017-11-24 NOTE — Progress Notes (Signed)
Spoke with [employer] Pal[employer]andy RN. I spoke with Dr. Jolayne Pantheronstant and pt is OB cleared.

## 2017-11-24 NOTE — ED Provider Notes (Signed)
[facility] EMERGENCY DEPARTMENT Provider Note   CSN: [id number] Arrival date & time: 11/24/17  0845     History   Chief Complaint Chief Complaint  Patient presents with  . Hemorrhoids    HPI Mariah [name] is a 42 y.o. female.  Patient is a 42 year old female who presents to the emergency department with her husband because of hemorrhoids.  The patient is 28 to 29 weeks in her pregnancy.  She is been having problems with hemorrhoids over the last 3 or 4 days.  The she is noted some minor bleeding present.  Patient states that she had a semisoft bowel movement about 3 days ago.  She says she has not had a bowel movement since that time.  She has not had fever or chills.  She has not had any operations or procedures involving the rectal area.  Patient states she has tried sitz bath's, which hazel, and Preparation H but these do not seem to be working at all.  She presents now for assistance with this issue.  The history is provided by the patient.    Past Medical History:  Diagnosis Date  . Anxiety   . Chronic chest wall pain   . Headache    migraines  . Vertigo     Patient Active Problem List   Diagnosis Date Noted  . Low lying placenta, antepartum 09/06/2017  . Supervision of high risk pregnancy, antepartum 08/02/2017  . 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 AMA (advanced maternal age) multigravida 35+ 07/04/2017    Past Surgical History:  Procedure Laterality Date  . KNEE ARTHROSCOPY    . WISDOM TOOTH EXTRACTION       OB History    Gravida  4   Para      Term      Preterm      AB  3   Living  0     SAB  3   TAB      Ectopic      Multiple      Live Births               Home Medications    Prior to Admission medications   Medication Sig Start Date End Date Taking? Authorizing Provider  acetaminophen (TYLENOL) 500 MG tablet Take 500 mg by mouth every 6 (six) hours as needed.    [provider]  aspirin EC 81 MG  tablet Take 2 tablets (162 mg total) by mouth daily. 08/02/17   Cresenzo-Dishmon, Scarlette Calico, CNM  butalbital-acetaminophen-caffeine (FIORICET, ESGIC) [phone] MG tablet Take 1 tablet by mouth every 6 (six) hours as needed for headache. 10/04/17   Cresenzo-Dishmon, Scarlette Calico, CNM  cetirizine (ZYRTEC) 10 MG tablet Take 10 mg by mouth daily.    [provider]  prenatal vitamin w/FE, FA (PRENATAL 1 + 1) 27-1 MG TABS tablet Take 1 tablet by mouth daily at 12 noon. 07/04/17   Adline Potter, NP    Family History Family History  Problem Relation Age of Onset  . Heart disease Paternal Grandfather   . Heart attack Paternal Grandfather   . Diabetes Paternal Grandmother   . Hypertension Paternal Grandmother   . Asthma Paternal Grandmother   . Cancer Paternal Grandmother        SKIN   . Atrial fibrillation Paternal Grandmother        PACEMAKER  . Hypertension Father   . COPD Father   . Heart attack Father   . Diabetes Mother   .  Hypertension Mother   . Mental illness Mother        bipolar  . Cancer Brother        skin  . Hypertension Brother   . Cancer Paternal Aunt        brain   . Cancer Paternal Uncle        bone    Social History Social History   Tobacco Use  . Smoking status: Former Smoker    Types: Cigarettes    Last attempt to quit: 05/27/2014    Years since quitting: 3.4  . Smokeless tobacco: Never Used  Substance Use Topics  . Alcohol use: Not Currently    Frequency: Never  . Drug use: No     Allergies   Penicillins   Review of Systems Review of Systems  Constitutional: Negative for activity change.       All ROS Neg except as noted in HPI  HENT: Negative for nosebleeds.   Eyes: Negative for photophobia and discharge.  Respiratory: Negative for cough, shortness of breath and wheezing.   Cardiovascular: Negative for chest pain and palpitations.  Gastrointestinal: Positive for rectal pain. Negative for abdominal pain and blood in stool.  Genitourinary:  Negative for dysuria, frequency and hematuria.  Musculoskeletal: Negative for arthralgias, back pain and neck pain.  Skin: Negative.   Neurological: Negative for dizziness, seizures and speech difficulty.  Psychiatric/Behavioral: Negative for confusion and hallucinations.     Physical Exam Updated Vital Signs BP 134/85   Pulse 98   Temp 97.7 F (36.5 C) (Oral)   Resp 18   Ht 5\' 9"  (1.753 m)   Wt 88 kg (194 lb)   LMP 05/03/2017 (Within Days)   SpO2 98%   BMI 28.65 kg/m   Physical Exam  Constitutional: Vital signs are normal. She appears well-developed and well-nourished. She is active.  HENT:  Head: Normocephalic and atraumatic.  Right Ear: Tympanic membrane, external ear and ear canal normal.  Left Ear: Tympanic membrane, external ear and ear canal normal.  Nose: Nose normal.  Mouth/Throat: Uvula is midline, oropharynx is clear and moist and mucous membranes are normal.  Eyes: Pupils are equal, round, and reactive to light. Conjunctivae, EOM and lids are normal.  Neck: Trachea normal, normal range of motion and phonation normal. Neck supple. Carotid bruit is not present.  Cardiovascular: Normal rate, regular rhythm and normal pulses.  Abdominal: Soft. Normal appearance and bowel sounds are normal.  Abdomen is gravid.  Genitourinary:  Genitourinary Comments: Patient's husband and chaperone in the room during the examination.  No fistula or other communication noted of the external anal area.  The patient has a hemorrhoid between the 9:00 and 10 o'clock position.  The very distal portion of the hemorrhoid has some bluish tint to it, but everything else is pink.  The area is tender to touch.  I do not feel an internal hemorrhoid, however examination is limited because of pain.  Lymphadenopathy:       Head (right side): No submental, no preauricular and no posterior auricular adenopathy present.       Head (left side): No submental, no preauricular and no posterior auricular  adenopathy present.    She has no cervical adenopathy.  Neurological: She is alert. She has normal strength. No cranial nerve deficit or sensory deficit. GCS eye subscore is 4. GCS verbal subscore is 5. GCS motor subscore is 6.  Skin: Skin is warm and dry.  Psychiatric: Her speech is normal.  ED Treatments / Results  Labs (all labs ordered are listed, but only abnormal results are displayed) Labs Reviewed - No data to display  EKG None  Radiology No results found.  Procedures Procedures (including critical care time)  Medications Ordered in ED Medications - No data to display   Initial Impression / Assessment and Plan / ED Course  I have reviewed the triage vital signs and the nursing notes.  Pertinent labs & imaging results that were available during my care of the patient were reviewed by me and considered in my medical decision making (see chart for details).       Final Clinical Impressions(s) / ED Diagnoses MDM  Vital signs are within normal limits.  Pulse oximetry is 98% on room air.  There is no red streaking noted in the anal area or signs of any cellulitis.  The patient has a hemorrhoid between the 9 and 10 o'clock position.  I reduced the hemorrhoid during the examination.  Patient states she is still quite sore and is noticing only minimal improvement in her pain at this time.  I discussed with the patient the importance of increasing the sitz bath's.  We discussed increasing bran in the diet.  The patient is going to increase leafy green vegetables, and use chocolate pudding.  Patient will be given prescription for Anusol HC to use.  I have asked the patient to be in touch with her GYN physician for any additional suggestions.  The patient will use Tylenol extra strength every 4 hours over the next 3 days to develop a good blood level.  Patient is in agreement with this plan.   Final diagnoses:  Hemorrhoids during pregnancy in third trimester    ED  Discharge Orders        Ordered    hydrocortisone (ANUSOL-HC) 25 MG suppository  2 times daily     11/24/17 1003       Ivery Quale, PA-C 11/24/17 2143    Blane Ohara, MD 11/25/17 1145

## 2017-11-27 ENCOUNTER — Telehealth: Payer: Self-pay | Admitting: Obstetrics & Gynecology

## 2017-11-27 ENCOUNTER — Ambulatory Visit (INDEPENDENT_AMBULATORY_CARE_PROVIDER_SITE_OTHER): Payer: Medicaid Other | Admitting: Obstetrics & Gynecology

## 2017-11-27 ENCOUNTER — Encounter: Payer: Self-pay | Admitting: Obstetrics & Gynecology

## 2017-11-27 VITALS — BP 121/71 | HR 97 | Wt 198.0 lb

## 2017-11-27 DIAGNOSIS — K645 Perianal venous thrombosis: Secondary | ICD-10-CM | POA: Diagnosis not present

## 2017-11-27 DIAGNOSIS — Z331 Pregnant state, incidental: Secondary | ICD-10-CM

## 2017-11-27 DIAGNOSIS — Z1389 Encounter for screening for other disorder: Secondary | ICD-10-CM

## 2017-11-27 LAB — POCT URINALYSIS DIPSTICK OB
GLUCOSE, UA: NEGATIVE — AB
Ketones, UA: NEGATIVE
Leukocytes, UA: NEGATIVE
Nitrite, UA: NEGATIVE
POC,PROTEIN,UA: NEGATIVE
RBC UA: NEGATIVE

## 2017-11-27 MED ORDER — HYDROCORTISONE 2.5 % RE CREA: 1.0000 "application " | TOPICAL_CREAM | Freq: Two times a day (BID) | RECTAL | 2 refills | Status: DC

## 2017-11-27 MED ORDER — LIDOCAINE HCL 2 % EX GEL: 1.0000 "application " | CUTANEOUS | 0 refills | Status: DC | PRN

## 2017-11-27 NOTE — Progress Notes (Signed)
Incision Thrombosed Hemorrhoid Procedure Note  Pre-operative Diagnosis: Thrombosed external hemorrhoid  Post-operative Diagnosis: Thrombosed external hemorrhoid  Locations:12 o clock  Indications: Painful external hemorrhoid. Explained surgical vs conservative treatment; surgical incision and drainage of clot usually works quickly to reduce pain and shorten healing time, but is uncomfortable to perform.  After discussion, the patient wishes to proceed with this procedure.  Anesthesia: not required   Procedure Details  After adequate anesthesia, an elliptical incision was made, and the visible clot was extruded with curved hemostat. This was well tolerated. Bulky dressing is applied.  Complications: none.  Plan: 1. Very frequent Sitz baths. 2. miralax tid prn and increase fluids to prevent constipation.   3. Call or return to clinic prn if these symptoms worsen or fail to improve as anticipated  Meds ordered this encounter  Medications  . hydrocortisone (ANUSOL-HC) 2.5 % rectal cream    Sig: Place 1 application rectally 2 (two) times daily.    Dispense:  30 g    Refill:  2   Return for keep scheduled.

## 2017-11-27 NOTE — Telephone Encounter (Signed)
Pt called stating that she has a hemorrhoid that she has been dealing with for over a week. She states that it is painful, and is black in color now. She states that she has everything possible to help with this. Advised that she should make an appt to have it evaluated by a provider. Pt verbalized understanding.

## 2017-12-07 ENCOUNTER — Encounter: Payer: Self-pay | Admitting: Obstetrics & Gynecology

## 2017-12-07 ENCOUNTER — Ambulatory Visit (INDEPENDENT_AMBULATORY_CARE_PROVIDER_SITE_OTHER): Payer: Medicaid Other | Admitting: Obstetrics & Gynecology

## 2017-12-07 VITALS — BP 107/69 | HR 120 | Wt 198.0 lb

## 2017-12-07 DIAGNOSIS — O0993 Supervision of high risk pregnancy, unspecified, third trimester: Secondary | ICD-10-CM

## 2017-12-07 DIAGNOSIS — Z1389 Encounter for screening for other disorder: Secondary | ICD-10-CM

## 2017-12-07 DIAGNOSIS — O09523 Supervision of elderly multigravida, third trimester: Secondary | ICD-10-CM

## 2017-12-07 DIAGNOSIS — Z331 Pregnant state, incidental: Secondary | ICD-10-CM

## 2017-12-07 DIAGNOSIS — Z3A31 31 weeks gestation of pregnancy: Secondary | ICD-10-CM

## 2017-12-07 LAB — POCT URINALYSIS DIPSTICK OB
Blood, UA: NEGATIVE
Glucose, UA: NEGATIVE — AB
KETONES UA: NEGATIVE
LEUKOCYTES UA: NEGATIVE
NITRITE UA: NEGATIVE

## 2017-12-07 NOTE — Progress Notes (Signed)
   HIGH-RISK PREGNANCY VISIT Patient name: Mariah [name] MRN [id number]  Date of birth: 06/11/1975 Chief Complaint:   High Risk Gestation  History of Present Illness:   Mariah Apaamara V Blum is a 42 y.o. 784P0030 female at 2921w1d with an Estimated Date of Delivery: 02/07/18 being seen today for ongoing management of a high-risk pregnancy complicated by AMA.  Today she reports no complaints. Contractions: Not present.  .  Movement: Present. denies leaking of fluid.  Review of Systems:   Pertinent items are noted in HPI Denies abnormal vaginal discharge w/ itching/odor/irritation, headaches, visual changes, shortness of breath, chest pain, abdominal pain, severe nausea/vomiting, or problems with urination or bowel movements unless otherwise stated above. Pertinent History Reviewed:  Reviewed past medical,surgical, social, obstetrical and family history.  Reviewed problem list, medications and allergies. Physical Assessment:   Vitals:   12/07/17 0905  BP: 107/69  Pulse: (!) 120  Weight: 198 lb (89.8 kg)  Body mass index is 29.24 kg/m.           Physical Examination:   General appearance: alert, well appearing, and in no distress  Mental status: alert, oriented to person, place, and time  Skin: warm & dry   Extremities: Edema: Trace    Cardiovascular: normal heart rate noted  Respiratory: normal respiratory effort, no distress  Abdomen: gravid, soft, non-tender  Pelvic: Cervical exam deferred         Hemorrhoid is much much better  Fetal Status:     Movement: Present    Fetal Surveillance Testing today:    Results for orders placed or performed in visit on 12/07/17 (from the past 24 hour(s))  POC Urinalysis Dipstick OB   Collection Time: 12/07/17  9:16 AM  Result Value Ref Range   Color, UA     Clarity, UA     Glucose, UA Negative (A) (none)   Bilirubin, UA     Ketones, UA neg    Spec Grav, UA     Blood, UA neg    pH, UA     POC Protein UA Trace Negative, Trace   Urobilinogen, UA     Nitrite, UA neg    Leukocytes, UA Negative Negative   Appearance     Odor      Assessment & Plan:  1) High-risk pregnancy G4P0030 at 5921w1d with an Estimated Date of Delivery: 02/07/18   2) AMA, stable, testing at 36 weeks with EFW, IOL 40 weeks or as indicated    Meds: No orders of the defined types were placed in this encounter.   Labs/procedures today:   Treatment Plan:  Fetal surveillance at 36 weeks with EFW IOL 40 weeks or as clinically indicated  Reviewed: Preterm labor symptoms and general obstetric precautions including but not limited to vaginal bleeding, contractions, leaking of fluid and fetal movement were reviewed in detail with the patient.  All questions were answered.  Follow-up: No follow-ups on file.  Orders Placed This Encounter  Procedures  . POC Urinalysis Dipstick OB   Lazaro ArmsLuther H Eure CNM, [facility]NP-BC 12/07/2017 9:34 AM

## 2017-12-21 ENCOUNTER — Ambulatory Visit (INDEPENDENT_AMBULATORY_CARE_PROVIDER_SITE_OTHER): Payer: Medicaid Other | Admitting: Women's Health

## 2017-12-21 ENCOUNTER — Encounter: Payer: Self-pay | Admitting: Women's Health

## 2017-12-21 VITALS — BP 109/69 | HR 101 | Wt 205.0 lb

## 2017-12-21 DIAGNOSIS — O09523 Supervision of elderly multigravida, third trimester: Secondary | ICD-10-CM

## 2017-12-21 DIAGNOSIS — Z3A33 33 weeks gestation of pregnancy: Secondary | ICD-10-CM

## 2017-12-21 DIAGNOSIS — Z23 Encounter for immunization: Secondary | ICD-10-CM | POA: Diagnosis not present

## 2017-12-21 DIAGNOSIS — Z331 Pregnant state, incidental: Secondary | ICD-10-CM

## 2017-12-21 DIAGNOSIS — O0993 Supervision of high risk pregnancy, unspecified, third trimester: Secondary | ICD-10-CM

## 2017-12-21 DIAGNOSIS — O0990 Supervision of high risk pregnancy, unspecified, unspecified trimester: Secondary | ICD-10-CM

## 2017-12-21 DIAGNOSIS — Z1389 Encounter for screening for other disorder: Secondary | ICD-10-CM

## 2017-12-21 LAB — POCT URINALYSIS DIPSTICK OB
Blood, UA: NEGATIVE
GLUCOSE, UA: NEGATIVE
KETONES UA: NEGATIVE
Nitrite, UA: NEGATIVE
POC,PROTEIN,UA: NEGATIVE

## 2017-12-21 NOTE — Patient Instructions (Signed)
Mariah [name], I greatly value your feedback.  If you receive a survey following your visit with us today, we appreciate you taking the time to fill it out.  Thanks, Joellyn HaffKim Merlyn Conley, CNM, WHNP-BC   Call the office [phone]) or go to [facility] if:  You begin to have strong, frequent contractions  Your water breaks.  Sometimes it is a big gush of fluid, sometimes it is just a trickle that keeps getting your panties wet or running down your legs  You have vaginal bleeding.  It is normal to have a small amount of spotting if your cervix was checked.   You don't feel your baby moving like normal.  If you don't, get you something to eat and drink and lay down and focus on feeling your baby move.  You should feel at least 10 movements in 2 hours.  If you don't, you should call the office or go to [facility].    Tdap Vaccine  It is recommended that you get the Tdap vaccine during the third trimester of EACH pregnancy to help protect your baby from getting pertussis (whooping cough)  27-36 weeks is the BEST time to do this so that you can pass the protection on to your baby. During pregnancy is better than after pregnancy, but if you are unable to get it during pregnancy it will be offered at the hospital.   You can get this vaccine with us, at the health department, your family doctor, or some local pharmacies  Everyone who will be around your baby should also be up-to-date on their vaccines before the baby comes. Adults (who are not pregnant) only need 1 dose of Tdap during adulthood.   Third Trimester of Pregnancy The third trimester is from week 29 through week 42, months 7 through 9. The third trimester is a time when the fetus is growing rapidly. At the end of the ninth month, the fetus is about 20 inches in length and weighs 6-10 pounds.  BODY CHANGES Your body goes through many changes during pregnancy. The changes vary from woman to woman.   Your weight will continue to  increase. You can expect to gain 25-35 pounds (11-16 kg) by the end of the pregnancy.  You may begin to get stretch marks on your hips, abdomen, and breasts.  You may urinate more often because the fetus is moving lower into your pelvis and pressing on your bladder.  You may develop or continue to have heartburn as a result of your pregnancy.  You may develop constipation because certain hormones are causing the muscles that push waste through your intestines to slow down.  You may develop hemorrhoids or swollen, bulging veins (varicose veins).  You may have pelvic pain because of the weight gain and pregnancy hormones relaxing your joints between the bones in your pelvis. Backaches may result from overexertion of the muscles supporting your posture.  You may have changes in your hair. These can include thickening of your hair, rapid growth, and changes in texture. Some women also have hair loss during or after pregnancy, or hair that feels dry or thin. Your hair will most likely return to normal after your baby is born.  Your breasts will continue to grow and be tender. A yellow discharge may leak from your breasts called colostrum.  Your belly button may stick out.  You may feel short of breath because of your expanding uterus.  You may notice the fetus "dropping," or moving lower  in your abdomen.  You may have a bloody mucus discharge. This usually occurs a few days to a week before labor begins.  Your cervix becomes thin and soft (effaced) near your due date. WHAT TO EXPECT AT YOUR PRENATAL EXAMS  You will have prenatal exams every 2 weeks until week 36. Then, you will have weekly prenatal exams. During a routine prenatal visit:  You will be weighed to make sure you and the fetus are growing normally.  Your blood pressure is taken.  Your abdomen will be measured to track your baby's growth.  The fetal heartbeat will be listened to.  Any test results from the previous visit  will be discussed.  You may have a cervical check near your due date to see if you have effaced. At around 36 weeks, your caregiver will check your cervix. At the same time, your caregiver will also perform a test on the secretions of the vaginal tissue. This test is to determine if a type of bacteria, Group B streptococcus, is present. Your caregiver will explain this further. Your caregiver may ask you:  What your birth plan is.  How you are feeling.  If you are feeling the baby move.  If you have had any abnormal symptoms, such as leaking fluid, bleeding, severe headaches, or abdominal cramping.  If you have any questions. Other tests or screenings that may be performed during your third trimester include:  Blood tests that check for low iron levels (anemia).  Fetal testing to check the health, activity level, and growth of the fetus. Testing is done if you have certain medical conditions or if there are problems during the pregnancy. FALSE LABOR You may feel small, irregular contractions that eventually go away. These are called Braxton Hicks contractions, or false labor. Contractions may last for hours, days, or even weeks before true labor sets in. If contractions come at regular intervals, intensify, or become painful, it is best to be seen by your caregiver.  SIGNS OF LABOR   Menstrual-like cramps.  Contractions that are 5 minutes apart or less.  Contractions that start on the top of the uterus and spread down to the lower abdomen and back.  A sense of increased pelvic pressure or back pain.  A watery or bloody mucus discharge that comes from the vagina. If you have any of these signs before the 37th week of pregnancy, call your caregiver right away. You need to go to the hospital to get checked immediately. HOME CARE INSTRUCTIONS   Avoid all smoking, herbs, alcohol, and unprescribed drugs. These chemicals affect the formation and growth of the baby.  Follow your  caregiver's instructions regarding medicine use. There are medicines that are either safe or unsafe to take during pregnancy.  Exercise only as directed by your caregiver. Experiencing uterine cramps is a good sign to stop exercising.  Continue to eat regular, healthy meals.  Wear a good support bra for breast tenderness.  Do not use hot tubs, steam rooms, or saunas.  Wear your seat belt at all times when driving.  Avoid raw meat, uncooked cheese, cat litter boxes, and soil used by cats. These carry germs that can cause birth defects in the baby.  Take your prenatal vitamins.  Try taking a stool softener (if your caregiver approves) if you develop constipation. Eat more high-fiber foods, such as fresh vegetables or fruit and whole grains. Drink plenty of fluids to keep your urine clear or pale yellow.  Take warm sitz baths   to soothe any pain or discomfort caused by hemorrhoids. Use hemorrhoid cream if your caregiver approves.  If you develop varicose veins, wear support hose. Elevate your feet for 15 minutes, 3-4 times a day. Limit salt in your diet.  Avoid heavy lifting, wear low heal shoes, and practice good posture.  Rest a lot with your legs elevated if you have leg cramps or low back pain.  Visit your dentist if you have not gone during your pregnancy. Use a soft toothbrush to brush your teeth and be gentle when you floss.  A sexual relationship may be continued unless your caregiver directs you otherwise.  Do not travel far distances unless it is absolutely necessary and only with the approval of your caregiver.  Take prenatal classes to understand, practice, and ask questions about the labor and delivery.  Make a trial run to the hospital.  Pack your hospital bag.  Prepare the baby's nursery.  Continue to go to all your prenatal visits as directed by your caregiver. SEEK MEDICAL CARE IF:  You are unsure if you are in labor or if your water has broken.  You have  dizziness.  You have mild pelvic cramps, pelvic pressure, or nagging pain in your abdominal area.  You have persistent nausea, vomiting, or diarrhea.  You have a bad smelling vaginal discharge.  You have pain with urination. SEEK IMMEDIATE MEDICAL CARE IF:   You have a fever.  You are leaking fluid from your vagina.  You have spotting or bleeding from your vagina.  You have severe abdominal cramping or pain.  You have rapid weight loss or gain.  You have shortness of breath with chest pain.  You notice sudden or extreme swelling of your face, hands, ankles, feet, or legs.  You have not felt your baby move in over an hour.  You have severe headaches that do not go away with medicine.  You have vision changes. Document Released: 04/04/2001 Document Revised: 04/15/2013 Document Reviewed: 06/11/2012 [facility] Patient Information 2015 [place], [place]. This information is not intended to replace advice given to you by your health care provider. Make sure you discuss any questions you have with your health care provider.

## 2017-12-21 NOTE — Progress Notes (Signed)
HIGH-RISK PREGNANCY VISIT Patient name: Mariah [name] MRN [id number]  Date of birth: 08/31/1975 Chief Complaint:   High Risk Gestation (head ache and stiff neck; carpal tunnel)  History of Present Illness:   Mariah [name] is a 42 y.o. 464P0030 female at 3471w1d with an Estimated Date of Delivery: 02/07/18 being seen today for ongoing management of a high-risk pregnancy complicated by AMA 42yo.  Today she reports crick in neck x 2-3d, causing back of head to hurt, gets better during day w/ stretching etc. Hasn't needed to take fioricet in over a month. Bilateral numbness/tingling in hands- has splint for Rt hand which helps, going to get another splint for Lt. Contractions: Irregular. Vag. Bleeding: None.  Movement: Present. denies leaking of fluid.  Review of Systems:   Pertinent items are noted in HPI Denies abnormal vaginal discharge w/ itching/odor/irritation, headaches, visual changes, shortness of breath, chest pain, abdominal pain, severe nausea/vomiting, or problems with urination or bowel movements unless otherwise stated above. Pertinent History Reviewed:  Reviewed past medical,surgical, social, obstetrical and family history.  Reviewed problem list, medications and allergies. Physical Assessment:   Vitals:   12/21/17 0922  BP: 109/69  Pulse: (!) 101  Weight: 205 lb (93 kg)  Body mass index is 30.27 kg/m.           Physical Examination:   General appearance: alert, well appearing, and in no distress  Mental status: alert, oriented to person, place, and time  Skin: warm & dry   Extremities: Edema: Trace    Cardiovascular: normal heart rate noted  Respiratory: normal respiratory effort, no distress  Abdomen: gravid, soft, non-tender  Pelvic: Cervical exam deferred         Fetal Status: Fetal Heart Rate (bpm): 125 Fundal Height: 33 cm Movement: Present    Fetal Surveillance Testing today: doppler   Results for orders placed or performed in visit on 12/21/17 (from the  past 24 hour(s))  POC Urinalysis Dipstick OB   Collection Time: 12/21/17  9:24 AM  Result Value Ref Range   Color, UA     Clarity, UA     Glucose, UA Negative Negative   Bilirubin, UA     Ketones, UA neg    Spec Grav, UA     Blood, UA neg    pH, UA     POC Protein UA Negative Negative, Trace   Urobilinogen, UA     Nitrite, UA neg    Leukocytes, UA Trace (A) Negative   Appearance     Odor      Assessment & Plan:  1) High-risk pregnancy G4P0030 at 5471w1d with an Estimated Date of Delivery: 02/07/18   2) AMA 42yo, stable, continue baby asa, last efw @ 24wks  3) Crick in neck, heating pad, massage, apap as needed, let us know if not improving/new sx  4) CTS> bilateral wrist splints  Meds: No orders of the defined types were placed in this encounter.   Labs/procedures today: tdap  Treatment Plan:  U/S ASAP for growth then again at 36wks    2x/wk testing nst/sono @ 36wks    Deliver @ 40wks  Reviewed: Preterm labor symptoms and general obstetric precautions including but not limited to vaginal bleeding, contractions, leaking of fluid and fetal movement were reviewed in detail with the patient.  Reviewed Tdap recommendations per CDC guidelines. All questions were answered.  Follow-up: Return for asap efw u/s, then 3wks for HROB w/ EFW/BPP/Dopp u/s.  Orders Placed This Encounter  Procedures  . [place] MFM OB FOLLOW UP  . Tdap vaccine greater than or equal to 7yo IM  . POC Urinalysis Dipstick OB   Cheral Marker CNM, [facility] 12/21/2017 9:52 AM

## 2017-12-25 ENCOUNTER — Ambulatory Visit (INDEPENDENT_AMBULATORY_CARE_PROVIDER_SITE_OTHER): Payer: Medicaid Other

## 2017-12-25 DIAGNOSIS — O0992 Supervision of high risk pregnancy, unspecified, second trimester: Secondary | ICD-10-CM

## 2017-12-25 DIAGNOSIS — O09522 Supervision of elderly multigravida, second trimester: Secondary | ICD-10-CM | POA: Diagnosis not present

## 2017-12-25 DIAGNOSIS — O4440 Low lying placenta NOS or without hemorrhage, unspecified trimester: Secondary | ICD-10-CM | POA: Diagnosis not present

## 2017-12-25 DIAGNOSIS — O0990 Supervision of high risk pregnancy, unspecified, unspecified trimester: Secondary | ICD-10-CM

## 2017-12-25 NOTE — Progress Notes (Signed)
[place] 33+5 wks,cephalic,cx 3 cm,anterior pl gr 1,bilat adnexa's wnl,afi 16 cm,fhr 129 bpm,EFW 2959 g 98%

## 2018-01-10 ENCOUNTER — Other Ambulatory Visit: Payer: Self-pay | Admitting: Women's Health

## 2018-01-10 DIAGNOSIS — O09523 Supervision of elderly multigravida, third trimester: Secondary | ICD-10-CM

## 2018-01-10 DIAGNOSIS — O0993 Supervision of high risk pregnancy, unspecified, third trimester: Secondary | ICD-10-CM

## 2018-01-11 ENCOUNTER — Ambulatory Visit (INDEPENDENT_AMBULATORY_CARE_PROVIDER_SITE_OTHER): Payer: Medicaid Other | Admitting: Women's Health

## 2018-01-11 ENCOUNTER — Ambulatory Visit (INDEPENDENT_AMBULATORY_CARE_PROVIDER_SITE_OTHER): Payer: Medicaid Other

## 2018-01-11 ENCOUNTER — Encounter: Payer: Self-pay | Admitting: Women's Health

## 2018-01-11 VITALS — BP 116/68 | HR 94 | Wt 206.6 lb

## 2018-01-11 DIAGNOSIS — O0993 Supervision of high risk pregnancy, unspecified, third trimester: Secondary | ICD-10-CM | POA: Diagnosis not present

## 2018-01-11 DIAGNOSIS — Z1389 Encounter for screening for other disorder: Secondary | ICD-10-CM

## 2018-01-11 DIAGNOSIS — Z331 Pregnant state, incidental: Secondary | ICD-10-CM

## 2018-01-11 DIAGNOSIS — O09523 Supervision of elderly multigravida, third trimester: Secondary | ICD-10-CM

## 2018-01-11 DIAGNOSIS — O0990 Supervision of high risk pregnancy, unspecified, unspecified trimester: Secondary | ICD-10-CM

## 2018-01-11 DIAGNOSIS — Z3A36 36 weeks gestation of pregnancy: Secondary | ICD-10-CM

## 2018-01-11 DIAGNOSIS — Z23 Encounter for immunization: Secondary | ICD-10-CM

## 2018-01-11 LAB — POCT URINALYSIS DIPSTICK OB
Blood, UA: NEGATIVE
Glucose, UA: NEGATIVE
Ketones, UA: NEGATIVE
Leukocytes, UA: NEGATIVE
Nitrite, UA: NEGATIVE
POC,PROTEIN,UA: NEGATIVE

## 2018-01-11 NOTE — Progress Notes (Signed)
US 36+1 wks,cephalic,anterior pl gr 2,normal ovaries bilat,BPP 8/8,AFI 15,FHR 129 BPM,EFW 3767 g 99%,RI .59,.53,.56=44%

## 2018-01-11 NOTE — Patient Instructions (Signed)
Alferd Apaamara V Pak, I greatly value your feedback.  If you receive a survey following your visit with us today, we appreciate you taking the time to fill it out.  Thanks, Joellyn HaffKim Fara Worthy, CNM, WHNP-BC   Call the office [phone]) or go to [facility] if:  You begin to have strong, frequent contractions  Your water breaks.  Sometimes it is a big gush of fluid, sometimes it is just a trickle that keeps getting your panties wet or running down your legs  You have vaginal bleeding.  It is normal to have a small amount of spotting if your cervix was checked.   You don't feel your baby moving like normal.  If you don't, get you something to eat and drink and lay down and focus on feeling your baby move.  You should feel at least 10 movements in 2 hours.  If you don't, you should call the office or go to [facility].     Preterm Labor and Birth Information The normal length of a pregnancy is 39-41 weeks. Preterm labor is when labor starts before 37 completed weeks of pregnancy. What are the risk factors for preterm labor? Preterm labor is more likely to occur in women who:  Have certain infections during pregnancy such as a bladder infection, sexually transmitted infection, or infection inside the uterus (chorioamnionitis).  Have a shorter-than-normal cervix.  Have gone into preterm labor before.  Have had surgery on their cervix.  Are younger than age 42 or older than age 635.  Are African American.  Are pregnant with twins or multiple babies (multiple gestation).  Take street drugs or smoke while pregnant.  Do not gain enough weight while pregnant.  Became pregnant shortly after having been pregnant.  What are the symptoms of preterm labor? Symptoms of preterm labor include:  Cramps similar to those that can happen during a menstrual period. The cramps may happen with diarrhea.  Pain in the abdomen or lower back.  Regular uterine contractions that may feel like tightening  of the abdomen.  A feeling of increased pressure in the pelvis.  Increased watery or bloody mucus discharge from the vagina.  Water breaking (ruptured amniotic sac).  Why is it important to recognize signs of preterm labor? It is important to recognize signs of preterm labor because babies who are born prematurely may not be fully developed. This can put them at an increased risk for:  Long-term (chronic) heart and lung problems.  Difficulty immediately after birth with regulating body systems, including blood sugar, body temperature, heart rate, and breathing rate.  Bleeding in the brain.  Cerebral palsy.  Learning difficulties.  Death.  These risks are highest for babies who are born before 34 weeks of pregnancy. How is preterm labor treated? Treatment depends on the length of your pregnancy, your condition, and the health of your baby. It may involve:  Having a stitch (suture) placed in your cervix to prevent your cervix from opening too early (cerclage).  Taking or being given medicines, such as: ? Hormone medicines. These may be given early in pregnancy to help support the pregnancy. ? Medicine to stop contractions. ? Medicines to help mature the baby's lungs. These may be prescribed if the risk of delivery is high. ? Medicines to prevent your baby from developing cerebral palsy.  If the labor happens before 34 weeks of pregnancy, you may need to stay in the hospital. What should I do if I think I am in preterm labor? If  you think that you are going into preterm labor, call your health care provider right away. How can I prevent preterm labor in future pregnancies? To increase your chance of having a full-term pregnancy:  Do not use any tobacco products, such as cigarettes, chewing tobacco, and e-cigarettes. If you need help quitting, ask your health care provider.  Do not use street drugs or medicines that have not been prescribed to you during your pregnancy.  Talk  with your health care provider before taking any herbal supplements, even if you have been taking them regularly.  Make sure you gain a healthy amount of weight during your pregnancy.  Watch for infection. If you think that you might have an infection, get it checked right away.  Make sure to tell your health care provider if you have gone into preterm labor before.  This information is not intended to replace advice given to you by your health care provider. Make sure you discuss any questions you have with your health care provider. Document Released: 07/01/2003 Document Revised: 09/21/2015 Document Reviewed: 09/01/2015 Elsevier Interactive Patient Education  2018 [employer].

## 2018-01-11 NOTE — Progress Notes (Signed)
   HIGH-RISK PREGNANCY VISIT Patient name: Mariah [name] MRN [id number]  Date of birth: 08/14/1975 Chief Complaint:   Routine Prenatal Visit (u/s- efw-bpp-dopp)  History of Present Illness:   Mariah Apaamara V Wanzer is a 42 y.o. 174P0030 female at 5912w1d with an Estimated Date of Delivery: 02/07/18 being seen today for ongoing management of a high-risk pregnancy complicated by AMA.  Today she reports occ BH, numbness/tingling fingers. Contractions: Irregular.  .  Movement: Present. denies leaking of fluid.  Review of Systems:   Pertinent items are noted in HPI Denies abnormal vaginal discharge w/ itching/odor/irritation, headaches, visual changes, shortness of breath, chest pain, abdominal pain, severe nausea/vomiting, or problems with urination or bowel movements unless otherwise stated above. Pertinent History Reviewed:  Reviewed past medical,surgical, social, obstetrical and family history.  Reviewed problem list, medications and allergies. Physical Assessment:   Vitals:   01/11/18 1136  BP: 116/68  Pulse: 94  Weight: 206 lb 9.6 oz (93.7 kg)  Body mass index is 30.51 kg/m.           Physical Examination:   General appearance: alert, well appearing, and in no distress  Mental status: alert, oriented to person, place, and time  Skin: warm & dry   Extremities: Edema: Trace    Cardiovascular: normal heart rate noted  Respiratory: normal respiratory effort, no distress  Abdomen: gravid, soft, non-tender  Pelvic: Cervical exam deferred         Fetal Status: Fetal Heart Rate (bpm): 129 u/s Fundal Height: 34 cm Movement: Present Presentation: Vertex  Fetal Surveillance Testing today: US 36+1 wks,cephalic,anterior pl gr 2,normal ovaries bilat,BPP 8/8,AFI 15,FHR 129 BPM,EFW 3767 g 99%,RI .59,.53,.56=44%   Results for orders placed or performed in visit on 01/11/18 (from the past 24 hour(s))  POC Urinalysis Dipstick OB   Collection Time: 01/11/18 11:49 AM  Result Value Ref Range   Color, UA     Clarity, UA     Glucose, UA Negative Negative   Bilirubin, UA     Ketones, UA neg    Spec Grav, UA     Blood, UA neg    pH, UA     POC Protein UA Negative Negative, Trace   Urobilinogen, UA     Nitrite, UA neg    Leukocytes, UA Negative Negative   Appearance     Odor      Assessment & Plan:  1) High-risk pregnancy G4P0030 at 3512w1d with an Estimated Date of Delivery: 02/07/18   2) AMA, stable, normal bpp/dopp, continue ASA  3) Suspected LGA, EFW 99% today  Meds: No orders of the defined types were placed in this encounter.   Labs/procedures today: u/s, flu shot  Treatment Plan:    2x/wk testing nst/sono @ 36wks    Deliver @ 40wks  Reviewed: Preterm labor symptoms and general obstetric precautions including but not limited to vaginal bleeding, contractions, leaking of fluid and fetal movement were reviewed in detail with the patient.  All questions were answered.  Follow-up: Return for tues hrob/nst, frid hrob bpp/dopp until 10/15.  Orders Placed This Encounter  Procedures  . Flu Vaccine QUAD 36+ mos IM (Fluarix, Quad PF)  . POC Urinalysis Dipstick OB   Cheral MarkerKimberly R Antoine Fiallos CNM, [facility] 01/11/2018 12:32 PM

## 2018-01-15 ENCOUNTER — Other Ambulatory Visit: Payer: Self-pay

## 2018-01-15 ENCOUNTER — Ambulatory Visit (INDEPENDENT_AMBULATORY_CARE_PROVIDER_SITE_OTHER): Payer: Medicaid Other | Admitting: Obstetrics & Gynecology

## 2018-01-15 ENCOUNTER — Inpatient Hospital Stay (HOSPITAL_BASED_OUTPATIENT_CLINIC_OR_DEPARTMENT_OTHER): Payer: Medicaid Other

## 2018-01-15 ENCOUNTER — Inpatient Hospital Stay (HOSPITAL_COMMUNITY)
Admission: AD | Admit: 2018-01-15 | Discharge: 2018-01-16 | Disposition: A | Discharge status: Home / Self Care | Payer: Medicaid Other | Source: Ambulatory Visit | Attending: Family Medicine | Admitting: Family Medicine

## 2018-01-15 ENCOUNTER — Encounter (HOSPITAL_COMMUNITY): Payer: Self-pay

## 2018-01-15 ENCOUNTER — Encounter: Payer: Self-pay | Admitting: Obstetrics & Gynecology

## 2018-01-15 VITALS — BP 127/75 | HR 111 | Wt 208.0 lb

## 2018-01-15 DIAGNOSIS — Z3689 Encounter for other specified antenatal screening: Secondary | ICD-10-CM

## 2018-01-15 DIAGNOSIS — Z87891 Personal history of nicotine dependence: Secondary | ICD-10-CM | POA: Insufficient documentation

## 2018-01-15 DIAGNOSIS — Z1389 Encounter for screening for other disorder: Secondary | ICD-10-CM

## 2018-01-15 DIAGNOSIS — Z3A36 36 weeks gestation of pregnancy: Secondary | ICD-10-CM

## 2018-01-15 DIAGNOSIS — Z331 Pregnant state, incidental: Secondary | ICD-10-CM

## 2018-01-15 DIAGNOSIS — O09523 Supervision of elderly multigravida, third trimester: Secondary | ICD-10-CM

## 2018-01-15 DIAGNOSIS — O4690 Antepartum hemorrhage, unspecified, unspecified trimester: Secondary | ICD-10-CM

## 2018-01-15 DIAGNOSIS — O4693 Antepartum hemorrhage, unspecified, third trimester: Secondary | ICD-10-CM | POA: Diagnosis not present

## 2018-01-15 DIAGNOSIS — O0993 Supervision of high risk pregnancy, unspecified, third trimester: Secondary | ICD-10-CM

## 2018-01-15 DIAGNOSIS — O09513 Supervision of elderly primigravida, third trimester: Secondary | ICD-10-CM | POA: Diagnosis not present

## 2018-01-15 DIAGNOSIS — N930 Postcoital and contact bleeding: Secondary | ICD-10-CM

## 2018-01-15 DIAGNOSIS — O0990 Supervision of high risk pregnancy, unspecified, unspecified trimester: Secondary | ICD-10-CM

## 2018-01-15 DIAGNOSIS — Z880 Allergy status to penicillin: Secondary | ICD-10-CM | POA: Insufficient documentation

## 2018-01-15 DIAGNOSIS — Z7982 Long term (current) use of aspirin: Secondary | ICD-10-CM | POA: Diagnosis not present

## 2018-01-15 LAB — POCT URINALYSIS DIPSTICK OB
Glucose, UA: NEGATIVE
KETONES UA: NEGATIVE
Leukocytes, UA: NEGATIVE
Nitrite, UA: NEGATIVE
POC,PROTEIN,UA: NEGATIVE
RBC UA: NEGATIVE

## 2018-01-15 NOTE — Progress Notes (Signed)
   HIGH-RISK PREGNANCY VISIT Patient name: Mariah [name] MRN [id number]  Date of birth: 05/23/1975 Chief Complaint:   High Risk Gestation (NST; GBS/GC)  History of Present Illness:   Mariah [name] is a 42 y.o. 214P0030 female at 5360w5d with an Estimated Date of Delivery: 02/07/18 being seen today for ongoing management of a high-risk pregnancy complicated by AMA.  Today she reports no complaints. Contractions: Irregular. Vag. Bleeding: None.  Movement: Present. denies leaking of fluid.  Review of Systems:   Pertinent items are noted in HPI Denies abnormal vaginal discharge w/ itching/odor/irritation, headaches, visual changes, shortness of breath, chest pain, abdominal pain, severe nausea/vomiting, or problems with urination or bowel movements unless otherwise stated above. Pertinent History Reviewed:  Reviewed past medical,surgical, social, obstetrical and family history.  Reviewed problem list, medications and allergies. Physical Assessment:   Vitals:   01/15/18 1403  BP: 127/75  Pulse: (!) 111  Weight: 208 lb (94.3 kg)  Body mass index is 30.72 kg/m.           Physical Examination:   General appearance: alert, well appearing, and in no distress  Mental status: alert, oriented to person, place, and time  Skin: warm & dry   Extremities: Edema: Trace    Cardiovascular: normal heart rate noted  Respiratory: normal respiratory effort, no distress  Abdomen: gravid, soft, non-tender  Pelvic: Cervical exam deferred         Fetal Status:     Movement: Present    Fetal Surveillance Testing today: reactive NST   Results for orders placed or performed in visit on 01/15/18 (from the past 24 hour(s))  POC Urinalysis Dipstick OB   Collection Time: 01/15/18  2:04 PM  Result Value Ref Range   Color, UA     Clarity, UA     Glucose, UA Negative Negative   Bilirubin, UA     Ketones, UA neg    Spec Grav, UA     Blood, UA neg    pH, UA     POC Protein UA Negative Negative, Trace   Urobilinogen, UA     Nitrite, UA neg    Leukocytes, UA Negative Negative   Appearance     Odor      Assessment & Plan:  1) High-risk pregnancy G4P0030 at 5360w5d with an Estimated Date of Delivery: 02/07/18   2) AMA, stable  3) Suspected fetal macrosomia, unstable, normal 2 hour  Meds: No orders of the defined types were placed in this encounter.   Labs/procedures today: Reactive NST  Treatment Plan:  Twice weekly testing to 40 weeks, she and I will discuss delivery mode at 38+ weeks, also BTL  Reviewed: Term labor symptoms and general obstetric precautions including but not limited to vaginal bleeding, contractions, leaking of fluid and fetal movement were reviewed in detail with the patient.  All questions were answered.  Follow-up: No follow-ups on file.  Orders Placed This Encounter  Procedures  . Strep Gp B NAA+Rflx  . GC/Chlamydia Probe Amp  . POC Urinalysis Dipstick OB   Lazaro ArmsLuther H Eure  01/15/2018 2:45 PM

## 2018-01-15 NOTE — MAU Provider Note (Signed)
History     CSN: [id number]  Arrival date and time: 01/15/18 2131   First Provider Initiated Contact with Patient 01/15/18 2227      Chief Complaint  Patient presents with  . Vaginal Bleeding   Mariah [name] is a 42 y.o. G4P0 at [redacted]w[redacted]d who presents to MAU with complaints of vaginal bleeding. She reports vaginal bleeding started occurring tonight after intercourse. She describes vaginal bleeding as bright red vaginal bleeding without clots mixed with mucus, wore a pad on the way to MAU but denies soaking through pad or having to change pad. She reports +FM, denies abdominal pain or cramping. She reports being unsure if her water broke due to intercourse or if mucus fluid was semen. Pregnancy is complicated by AMA, reports having a low lying placenta earlier in pregnancy but it resolved. Receives prenatal care at [facility] and was seen in the office today, reports cervix was check and she was closed.    OB History    Gravida  4   Para      Term      Preterm      AB  3   Living  0     SAB  3   TAB      Ectopic      Multiple      Live Births              Past Medical History:  Diagnosis Date  . Anxiety   . Chronic chest wall pain   . Headache    migraines  . Vertigo     Past Surgical History:  Procedure Laterality Date  . KNEE ARTHROSCOPY    . WISDOM TOOTH EXTRACTION      Family History  Problem Relation Age of Onset  . Heart disease Paternal Grandfather   . Heart attack Paternal Grandfather   . Diabetes Paternal Grandmother   . Hypertension Paternal Grandmother   . Asthma Paternal Grandmother   . Cancer Paternal Grandmother        SKIN   . Atrial fibrillation Paternal Grandmother        PACEMAKER  . Hypertension Father   . COPD Father   . Heart attack Father   . Diabetes Mother   . Hypertension Mother   . Mental illness Mother        bipolar  . Cancer Brother        skin  . Hypertension Brother   . Cancer Paternal Aunt        brain   .  Cancer Paternal Uncle        bone    Social History   Tobacco Use  . Smoking status: Former Smoker    Types: Cigarettes    Last attempt to quit: 05/27/2014    Years since quitting: 3.6  . Smokeless tobacco: Never Used  Substance Use Topics  . Alcohol use: Not Currently    Frequency: Never  . Drug use: No    Allergies:  Allergies  Allergen Reactions  . Penicillins Hives    Medications Prior to Admission  Medication Sig Dispense Refill Last Dose  . acetaminophen (TYLENOL) 500 MG tablet Take 500 mg by mouth every 6 (six) hours as needed.   Past Month at Unknown time  . aspirin EC 81 MG tablet Take 2 tablets (162 mg total) by mouth daily. 60 tablet 6 01/15/2018 at Unknown time  . cetirizine (ZYRTEC) 10 MG tablet Take 10 mg by mouth as needed.  Past Month at Unknown time  . diphenhydrAMINE (BENADRYL) 50 MG capsule Take 50 mg by mouth as needed.   Past Week at Unknown time  . prenatal vitamin w/FE, FA (PRENATAL 1 + 1) 27-1 MG TABS tablet Take 1 tablet by mouth daily at 12 noon. 30 each 12 01/15/2018 at Unknown time  . butalbital-acetaminophen-caffeine (FIORICET, ESGIC) 50-325-40 MG tablet Take 1 tablet by mouth every 6 (six) hours as needed for headache. 20 tablet 0 More than a month at Unknown time    Review of Systems  Constitutional: Negative.   Respiratory: Negative.   Cardiovascular: Negative.   Gastrointestinal: Negative.   Genitourinary: Positive for vaginal bleeding. Negative for difficulty urinating, dysuria, frequency, pelvic pain and urgency.  Neurological: Negative.    Physical Exam   Blood pressure (!) 141/85, pulse 95, temperature 98 F (36.7 C), temperature source Oral, resp. rate 18, height 5' 8.5" (1.74 m), weight 94.8 kg, last menstrual period 05/03/2017, SpO2 97 %.  Physical Exam  Nursing note and vitals reviewed. Constitutional: She is oriented to person, place, and time. She appears well-developed and well-nourished. No distress.  HENT:  Head:  Normocephalic.  Cardiovascular: Normal rate, regular rhythm and normal heart sounds.  Respiratory: Effort normal and breath sounds normal. No respiratory distress. She has no wheezes.  GI: Soft.  Gravid appropriate for gestational age  Genitourinary: There is bleeding in the vagina.  Genitourinary Comments: Pelvic exam: cervix pink, visually closed, moderate bright red vaginal bleeding present around cervical os (4 faux swabs used to remove bleeding), no clots present   Musculoskeletal: Normal range of motion. She exhibits no edema.  Neurological: She is alert and oriented to person, place, and time.  Psychiatric: She has a normal mood and affect. Her behavior is normal. Thought content normal.   Dilation: Closed Exam by:: V Rainee Sweatt CNM   FHR: 115/ moderate variability/ +accels/ no deceleration  Toco: no uterine contractions   MAU Course  Procedures  MDM Orders Placed This Encounter  Procedures  . [place] MFM OB LIMITED  . [place] MFM Fetal BPP Wo Non Stress   [place] reports from 9/20 noted anterior placenta   [place] report reviewed from today: Anterior placenta - no signs of previa or abruption  Cephalic presentation  FHR 131  AFV: WNL  BPP: 8/8  NST reactive  [place] results reviewed with patient. Vaginal bleeding most likely cervical friability. Educated and discussed pelvic rest and when to return to MAU for evaluation immediately. Labor precautions and fetal kick counts discussed. Patient reports good fetal movement, has appointment in office scheduled for Friday. Pt stable at time of discharge.   Assessment and Plan   1. PCB (post coital bleeding)   2. Supervision of high risk pregnancy, antepartum   3. Vaginal bleeding during pregnancy, antepartum   4. [redacted] weeks gestation of pregnancy   5. NST (non-stress test) reactive    Discharge home  Pelvic rest  Follow up as scheduled for prenatal appointments  Return to MAU as needed   Follow-up Information    [facility] Follow up.    Specialty:  Obstetrics and Gynecology Why:  Follow up as scheduled for prenatal appointments  Contact information: [address] [place] [address] [phone]         Allergies as of 01/16/2018      Reactions   Penicillins Hives      Medication List    TAKE these medications   acetaminophen 500 MG tablet Commonly  known as:  TYLENOL Take 500 mg by mouth every 6 (six) hours as needed.   aspirin EC 81 MG tablet Take 2 tablets (162 mg total) by mouth daily.   butalbital-acetaminophen-caffeine 50-325-40 MG tablet Commonly known as:  FIORICET, ESGIC Take 1 tablet by mouth every 6 (six) hours as needed for headache.   cetirizine 10 MG tablet Commonly known as:  ZYRTEC Take 10 mg by mouth as needed.   diphenhydrAMINE 50 MG capsule Commonly known as:  BENADRYL Take 50 mg by mouth as needed.   prenatal vitamin w/FE, FA 27-1 MG Tabs tablet Take 1 tablet by mouth daily at 12 noon.      Sharyon CableVeronica C Dorlene Footman CNM 01/15/2018, 11:47 PM

## 2018-01-15 NOTE — MAU Note (Signed)
Pt here for vaginal bleeding after intercourse tonight. States there was "a lot of blood" mixed with some fluid. Not sure if water is broken. Pt denies pain or contractions, just some pelvic pressure. Reports good fetal movement. Was in the office today and reports that she was closed

## 2018-01-17 ENCOUNTER — Other Ambulatory Visit: Payer: Self-pay | Admitting: Obstetrics & Gynecology

## 2018-01-17 DIAGNOSIS — O09523 Supervision of elderly multigravida, third trimester: Secondary | ICD-10-CM

## 2018-01-17 LAB — STREP GP B NAA+RFLX: Strep Gp B NAA+Rflx: NEGATIVE

## 2018-01-17 LAB — GC/CHLAMYDIA PROBE AMP
CHLAMYDIA, DNA PROBE: NEGATIVE
Neisseria gonorrhoeae by PCR: NEGATIVE

## 2018-01-18 ENCOUNTER — Ambulatory Visit (INDEPENDENT_AMBULATORY_CARE_PROVIDER_SITE_OTHER): Payer: Medicaid Other

## 2018-01-18 ENCOUNTER — Encounter: Payer: Self-pay | Admitting: Obstetrics & Gynecology

## 2018-01-18 ENCOUNTER — Other Ambulatory Visit: Payer: Self-pay

## 2018-01-18 ENCOUNTER — Ambulatory Visit (INDEPENDENT_AMBULATORY_CARE_PROVIDER_SITE_OTHER): Payer: Medicaid Other | Admitting: Obstetrics & Gynecology

## 2018-01-18 VITALS — BP 127/62 | HR 92 | Wt 209.0 lb

## 2018-01-18 DIAGNOSIS — Z1389 Encounter for screening for other disorder: Secondary | ICD-10-CM

## 2018-01-18 DIAGNOSIS — O0990 Supervision of high risk pregnancy, unspecified, unspecified trimester: Secondary | ICD-10-CM

## 2018-01-18 DIAGNOSIS — Z331 Pregnant state, incidental: Secondary | ICD-10-CM

## 2018-01-18 DIAGNOSIS — O0993 Supervision of high risk pregnancy, unspecified, third trimester: Secondary | ICD-10-CM

## 2018-01-18 DIAGNOSIS — O3663X1 Maternal care for excessive fetal growth, third trimester, fetus 1: Secondary | ICD-10-CM

## 2018-01-18 DIAGNOSIS — O09523 Supervision of elderly multigravida, third trimester: Secondary | ICD-10-CM | POA: Diagnosis not present

## 2018-01-18 LAB — POCT URINALYSIS DIPSTICK OB
Blood, UA: NEGATIVE
Glucose, UA: NEGATIVE
KETONES UA: NEGATIVE
Leukocytes, UA: NEGATIVE
NITRITE UA: NEGATIVE
PROTEIN: NEGATIVE

## 2018-01-18 NOTE — Progress Notes (Signed)
   HIGH-RISK PREGNANCY VISIT Patient name: Mariah [name] MRN [id number]  Date of birth: 02/18/1976 Chief Complaint:   High Risk Gestation (u/s today)  History of Present Illness:   Mariah [name] is a 42 y.o. G31P0030 female at [redacted]w[redacted]d with an Estimated Date of Delivery: 02/07/18 being seen today for ongoing management of a high-risk pregnancy complicated by AMA.  Today she reports no complaints. Contractions: Not present. Vag. Bleeding: None.  Movement: Present. denies leaking of fluid.  Review of Systems:   Pertinent items are noted in HPI Denies abnormal vaginal discharge w/ itching/odor/irritation, headaches, visual changes, shortness of breath, chest pain, abdominal pain, severe nausea/vomiting, or problems with urination or bowel movements unless otherwise stated above. Pertinent History Reviewed:  Reviewed past medical,surgical, social, obstetrical and family history.  Reviewed problem list, medications and allergies. Physical Assessment:   Vitals:   01/18/18 1049  BP: 127/62  Pulse: 92  Weight: 209 lb (94.8 kg)  Body mass index is 31.32 kg/m.           Physical Examination:   General appearance: alert, well appearing, and in no distress  Mental status: alert, oriented to person, place, and time  Skin: warm & dry   Extremities: Edema: Trace    Cardiovascular: normal heart rate noted  Respiratory: normal respiratory effort, no distress  Abdomen: gravid, soft, non-tender  Pelvic: Cervical exam deferred         Fetal Status: Fetal Heart Rate (bpm): 146 Fundal Height: 40 cm Movement: Present    Fetal Surveillance Testing today: BPP 8/8   Results for orders placed or performed in visit on 01/18/18 (from the past 24 hour(s))  POC Urinalysis Dipstick OB   Collection Time: 01/18/18 10:50 AM  Result Value Ref Range   Color, UA     Clarity, UA     Glucose, UA Negative Negative   Bilirubin, UA     Ketones, UA neg    Spec Grav, UA     Blood, UA neg    pH, UA     POC  Protein UA Negative Negative, Trace   Urobilinogen, UA     Nitrite, UA neg    Leukocytes, UA Negative Negative   Appearance     Odor      Assessment & Plan:  1) High-risk pregnancy G4P0030 at [redacted]w[redacted]d with an Estimated Date of Delivery: 02/07/18   2) AMA, stable  3) Suspected fetal macrosomia, stable, decision regarding delivery mode to be made next week  Meds: No orders of the defined types were placed in this encounter.   Labs/procedures today: BPP 8/8  Treatment Plan:  Twice weekly testing with decision on delivery timing and mode to be made next week  Reviewed: Term labor symptoms and general obstetric precautions including but not limited to vaginal bleeding, contractions, leaking of fluid and fetal movement were reviewed in detail with the patient.  All questions were answered.  Follow-up: Return in about 4 days (around 01/22/2018) for NST, HROB.  Orders Placed This Encounter  Procedures  . POC Urinalysis Dipstick OB   Luther H Eure  01/18/2018 11:00 AM

## 2018-01-18 NOTE — Progress Notes (Signed)
[place] 37+1 wks,cephalic,BPP 8/8,anterior pl gr 2,AFI 15 cm,normal ovaries bilat,fhr 146 bpm,RI .59,.49,.64=58%

## 2018-01-22 ENCOUNTER — Encounter: Payer: Self-pay | Admitting: Women's Health

## 2018-01-22 ENCOUNTER — Ambulatory Visit (INDEPENDENT_AMBULATORY_CARE_PROVIDER_SITE_OTHER): Payer: Medicaid Other | Admitting: Women's Health

## 2018-01-22 VITALS — BP 115/73 | HR 112 | Wt 209.0 lb

## 2018-01-22 DIAGNOSIS — O09523 Supervision of elderly multigravida, third trimester: Secondary | ICD-10-CM

## 2018-01-22 DIAGNOSIS — Z331 Pregnant state, incidental: Secondary | ICD-10-CM

## 2018-01-22 DIAGNOSIS — O0993 Supervision of high risk pregnancy, unspecified, third trimester: Secondary | ICD-10-CM

## 2018-01-22 DIAGNOSIS — O0990 Supervision of high risk pregnancy, unspecified, unspecified trimester: Secondary | ICD-10-CM

## 2018-01-22 DIAGNOSIS — Z3A37 37 weeks gestation of pregnancy: Secondary | ICD-10-CM

## 2018-01-22 DIAGNOSIS — Z1389 Encounter for screening for other disorder: Secondary | ICD-10-CM

## 2018-01-22 LAB — POCT URINALYSIS DIPSTICK OB
GLUCOSE, UA: NEGATIVE
KETONES UA: NEGATIVE
Leukocytes, UA: NEGATIVE
Nitrite, UA: NEGATIVE
POC,PROTEIN,UA: NEGATIVE

## 2018-01-22 NOTE — Patient Instructions (Signed)
Mariah [name], I greatly value your feedback.  If you receive a survey following your visit with [place] today, we appreciate you taking the time to fill it out.  Thanks, Mariah [name], CNM, WHNP-BC   Call the office [phone]) or go to [facility] if:  You begin to have strong, frequent contractions  Your water breaks.  Sometimes it is a big gush of fluid, sometimes it is just a trickle that keeps getting your panties wet or running down your legs  You have vaginal bleeding.  It is normal to have a small amount of spotting if your cervix was checked.   You don't feel your baby moving like normal.  If you don't, get you something to eat and drink and lay down and focus on feeling your baby move.  You should feel at least 10 movements in 2 hours.  If you don't, you should call the office or go to [facility].    [facility] Contractions Contractions of the uterus can occur throughout pregnancy, but they are not always a sign that you are in labor. You may have practice contractions called Braxton Hicks contractions. These false labor contractions are sometimes confused with true labor. What are Mariah [name] contractions? Braxton Hicks contractions are tightening movements that occur in the muscles of the uterus before labor. Unlike true labor contractions, these contractions do not result in opening (dilation) and thinning of the cervix. Toward the end of pregnancy (32-34 weeks), Braxton Hicks contractions can happen more often and may become stronger. These contractions are sometimes difficult to tell apart from true labor because they can be very uncomfortable. You should not feel embarrassed if you go to the hospital with false labor. Sometimes, the only way to tell if you are in true labor is for your health care provider to look for changes in the cervix. The health care provider will do a physical exam and may monitor your contractions. If you are not in true labor, the exam should  show that your cervix is not dilating and your water has not broken. If there are other health problems associated with your pregnancy, it is completely safe for you to be sent home with false labor. You may continue to have Braxton Hicks contractions until you go into true labor. How to tell the difference between true labor and false labor True labor  Contractions last 30-70 seconds.  Contractions become very regular.  Discomfort is usually felt in the top of the uterus, and it spreads to the lower abdomen and low back.  Contractions do not go away with walking.  Contractions usually become more intense and increase in frequency.  The cervix dilates and gets thinner. False labor  Contractions are usually shorter and not as strong as true labor contractions.  Contractions are usually irregular.  Contractions are often felt in the front of the lower abdomen and in the groin.  Contractions may go away when you walk around or change positions while lying down.  Contractions get weaker and are shorter-lasting as time goes on.  The cervix usually does not dilate or become thin. Follow these instructions at home:  Take over-the-counter and prescription medicines only as told by your health care provider.  Keep up with your usual exercises and follow other instructions from your health care provider.  Eat and drink lightly if you think you are going into labor.  If Braxton Hicks contractions are making you uncomfortable: ? Change your position from lying down  or resting to walking, or change from walking to resting. ? Sit and rest in a tub of warm water. ? Drink enough fluid to keep your urine pale yellow. Dehydration may cause these contractions. ? Do slow and deep breathing several times an hour.  Keep all follow-up prenatal visits as told by your health care provider. This is important. Contact a health care provider if:  You have a fever.  You have continuous pain in  your abdomen. Get help right away if:  Your contractions become stronger, more regular, and closer together.  You have fluid leaking or gushing from your vagina.  You pass blood-tinged mucus (bloody show).  You have bleeding from your vagina.  You have low back pain that you never had before.  You feel your baby's head pushing down and causing pelvic pressure.  Your baby is not moving inside you as much as it used to. Summary  Contractions that occur before labor are called Braxton Hicks contractions, false labor, or practice contractions.  Braxton Hicks contractions are usually shorter, weaker, farther apart, and less regular than true labor contractions. True labor contractions usually become progressively stronger and regular and they become more frequent.  Manage discomfort from [facility] contractions by changing position, resting in a warm bath, drinking plenty of water, or practicing deep breathing. This information is not intended to replace advice given to you by your health care provider. Make sure you discuss any questions you have with your health care provider. Document Released: 08/24/2016 Document Revised: 08/24/2016 Document Reviewed: 08/24/2016 Elsevier Interactive Patient Education  2018 [employer].

## 2018-01-22 NOTE — Progress Notes (Signed)
   HIGH-RISK PREGNANCY VISIT Patient name: Mariah [name] MRN [id number]  Date of birth: 03-Jul-1975 Chief Complaint:   High Risk Gestation (NST)  History of Present Illness:   Mariah [name] is a 42 y.o. G53P0030 female at [redacted]w[redacted]d with an Estimated Date of Delivery: 02/07/18 being seen today for ongoing management of a high-risk pregnancy complicated by AMA.  Today she reports just getting to miserable part- back/legs/hips everything hurts, hard to sleep. Contractions: Not present. Vag. Bleeding: None.  Movement: Present. denies leaking of fluid.  Review of Systems:   Pertinent items are noted in HPI Denies abnormal vaginal discharge w/ itching/odor/irritation, headaches, visual changes, shortness of breath, chest pain, abdominal pain, severe nausea/vomiting, or problems with urination or bowel movements unless otherwise stated above. Pertinent History Reviewed:  Reviewed past medical,surgical, social, obstetrical and family history.  Reviewed problem list, medications and allergies. Physical Assessment:   Vitals:   01/22/18 0908  BP: 115/73  Pulse: (!) 112  Weight: 209 lb (94.8 kg)  Body mass index is 31.32 kg/m.           Physical Examination:   General appearance: alert, well appearing, and in no distress  Mental status: alert, oriented to person, place, and time  Skin: warm & dry   Extremities: Edema: None    Cardiovascular: normal heart rate noted  Respiratory: normal respiratory effort, no distress  Abdomen: gravid, soft, non-tender  Pelvic: Cervical exam performed  Dilation: Closed Effacement (%): Thick Station: -3  Fetal Status: Fetal Heart Rate (bpm): 125 Fundal Height: 39 cm Movement: Present Presentation: Vertex  Fetal Surveillance Testing today: NST: FHR baseline 125 bpm, Variability: moderate, Accelerations:present, Decelerations:  Absent= Cat 1/Reactive Toco: none     Results for orders placed or performed in visit on 01/22/18 (from the past 24 hour(s))  POC  Urinalysis Dipstick OB   Collection Time: 01/22/18  9:09 AM  Result Value Ref Range   Color, UA     Clarity, UA     Glucose, UA Negative Negative   Bilirubin, UA     Ketones, UA neg    Spec Grav, UA     Blood, UA trace    pH, UA     POC Protein UA Negative Negative, Trace   Urobilinogen, UA     Nitrite, UA neg    Leukocytes, UA Negative Negative   Appearance     Odor      Assessment & Plan:  1) High-risk pregnancy G4P0030 at [redacted]w[redacted]d with an Estimated Date of Delivery: 02/07/18   2) AMA, stable  3) Suspected LGA, EFW 99% at 36wks, 80lb wt gain this pregnancy  Meds: No orders of the defined types were placed in this encounter.   Labs/procedures today: sve, nst  Treatment Plan:  2x/wk testing nst/sono         Deliver @ 40wks  Reviewed: Term labor symptoms and general obstetric precautions including but not limited to vaginal bleeding, contractions, leaking of fluid and fetal movement were reviewed in detail with the patient.  All questions were answered.  Follow-up: Return for As scheduled Fri for bpp/dopp u/s & HROB.  Orders Placed This Encounter  Procedures  . US FETAL BPP WO NON STRESS  . [place] UA Cord Doppler  . POC Urinalysis Dipstick OB   Cheral Marker CNM, [facility] 01/22/2018 9:39 AM

## 2018-01-25 ENCOUNTER — Ambulatory Visit (INDEPENDENT_AMBULATORY_CARE_PROVIDER_SITE_OTHER): Payer: Medicaid Other | Admitting: Obstetrics & Gynecology

## 2018-01-25 ENCOUNTER — Other Ambulatory Visit: Payer: Self-pay

## 2018-01-25 ENCOUNTER — Ambulatory Visit (INDEPENDENT_AMBULATORY_CARE_PROVIDER_SITE_OTHER): Payer: Medicaid Other

## 2018-01-25 ENCOUNTER — Encounter: Payer: Self-pay | Admitting: Obstetrics & Gynecology

## 2018-01-25 VITALS — BP 126/87 | HR 100 | Wt 209.0 lb

## 2018-01-25 DIAGNOSIS — Z3A38 38 weeks gestation of pregnancy: Secondary | ICD-10-CM

## 2018-01-25 DIAGNOSIS — O0993 Supervision of high risk pregnancy, unspecified, third trimester: Secondary | ICD-10-CM | POA: Diagnosis not present

## 2018-01-25 DIAGNOSIS — Z331 Pregnant state, incidental: Secondary | ICD-10-CM

## 2018-01-25 DIAGNOSIS — O3663X1 Maternal care for excessive fetal growth, third trimester, fetus 1: Secondary | ICD-10-CM

## 2018-01-25 DIAGNOSIS — O09523 Supervision of elderly multigravida, third trimester: Secondary | ICD-10-CM | POA: Diagnosis not present

## 2018-01-25 DIAGNOSIS — O0990 Supervision of high risk pregnancy, unspecified, unspecified trimester: Secondary | ICD-10-CM

## 2018-01-25 DIAGNOSIS — Z1389 Encounter for screening for other disorder: Secondary | ICD-10-CM

## 2018-01-25 LAB — POCT URINALYSIS DIPSTICK OB
Glucose, UA: NEGATIVE
Ketones, UA: NEGATIVE
Leukocytes, UA: NEGATIVE
NITRITE UA: NEGATIVE
PROTEIN: NEGATIVE
RBC UA: NEGATIVE

## 2018-01-25 NOTE — Progress Notes (Signed)
[place] 38+1 wks,cephalic,fhr 138 bpm,BPP 8/8,anterior pl gr 1,normal ovaries bilat,afi 13 cm,RI .52,.58,.53,.50=43%

## 2018-01-25 NOTE — Progress Notes (Signed)
   HIGH-RISK PREGNANCY VISIT Patient name: Mariah [name] MRN [id number]  Date of birth: 1975/08/14 Chief Complaint:   High Risk Gestation (u/s today)  History of Present Illness:   Mariah [name] is a 42 y.o. G46P0030 female at [redacted]w[redacted]d with an Estimated Date of Delivery: 02/07/18 being seen today for ongoing management of a high-risk pregnancy complicated by AMA.  Today she reports no complaints. Contractions: Not present. Vag. Bleeding: None.  Movement: Present. denies leaking of fluid.  Review of Systems:   Pertinent items are noted in HPI Denies abnormal vaginal discharge w/ itching/odor/irritation, headaches, visual changes, shortness of breath, chest pain, abdominal pain, severe nausea/vomiting, or problems with urination or bowel movements unless otherwise stated above. Pertinent History Reviewed:  Reviewed past medical,surgical, social, obstetrical and family history.  Reviewed problem list, medications and allergies. Physical Assessment:   Vitals:   01/25/18 1334  BP: 126/87  Pulse: 100  Weight: 209 lb (94.8 kg)  Body mass index is 31.32 kg/m.           Physical Examination:   General appearance: alert, well appearing, and in no distress  Mental status: alert, oriented to person, place, and time  Skin: warm & dry   Extremities: Edema: Trace    Cardiovascular: normal heart rate noted  Respiratory: normal respiratory effort, no distress  Abdomen: gravid, soft, non-tender  Pelvic: Cervical exam deferred         Fetal Status:     Movement: Present    Fetal Surveillance Testing today: BPP 8/8   Results for orders placed or performed in visit on 01/25/18 (from the past 24 hour(s))  POC Urinalysis Dipstick OB   Collection Time: 01/25/18  1:34 PM  Result Value Ref Range   Color, UA     Clarity, UA     Glucose, UA Negative Negative   Bilirubin, UA     Ketones, UA neg    Spec Grav, UA     Blood, UA neg    pH, UA     POC Protein UA Negative Negative, Trace   Urobilinogen, UA     Nitrite, UA neg    Leukocytes, UA Negative Negative   Appearance     Odor      Assessment & Plan:  1) High-risk pregnancy G4P0030 at [redacted]w[redacted]d with an Estimated Date of Delivery: 02/07/18   2) AMA, stable  3) Suspected fetal macrosomia, exam today baby is out of pelvis.  Discussed with patient at length.  Understands limitations of ultrasound, inaccuracies but EFW 4500 grams  If goes into spontaneous labor will labor, declines operative vaginal delivery.  If does not by 02/04/2018 will proceed with primary C section with BTL.    Meds: No orders of the defined types were placed in this encounter.   Labs/procedures today: BPP 8/8  Treatment Plan:  As above  Reviewed: Term labor symptoms and general obstetric precautions including but not limited to vaginal bleeding, contractions, leaking of fluid and fetal movement were reviewed in detail with the patient.  All questions were answered.  Follow-up: No follow-ups on file.  Orders Placed This Encounter  Procedures  . POC Urinalysis Dipstick OB   Lazaro Arms CNM, WHNP-BC 01/25/2018 2:12 PM

## 2018-01-28 ENCOUNTER — Telehealth (HOSPITAL_COMMUNITY): Payer: Self-pay | Admitting: *Deleted

## 2018-01-28 NOTE — Telephone Encounter (Signed)
Preadmission screen  

## 2018-01-29 ENCOUNTER — Encounter: Payer: Self-pay | Admitting: Advanced Practice Midwife

## 2018-01-29 ENCOUNTER — Telehealth (HOSPITAL_COMMUNITY): Payer: Self-pay | Admitting: *Deleted

## 2018-01-29 ENCOUNTER — Ambulatory Visit (INDEPENDENT_AMBULATORY_CARE_PROVIDER_SITE_OTHER): Payer: Medicaid Other | Admitting: Advanced Practice Midwife

## 2018-01-29 ENCOUNTER — Telehealth: Payer: Self-pay | Admitting: Obstetrics & Gynecology

## 2018-01-29 ENCOUNTER — Encounter (HOSPITAL_COMMUNITY): Payer: Self-pay

## 2018-01-29 VITALS — BP 116/76 | HR 123 | Wt 212.4 lb

## 2018-01-29 DIAGNOSIS — O0993 Supervision of high risk pregnancy, unspecified, third trimester: Secondary | ICD-10-CM | POA: Diagnosis not present

## 2018-01-29 DIAGNOSIS — Z1389 Encounter for screening for other disorder: Secondary | ICD-10-CM

## 2018-01-29 DIAGNOSIS — Z331 Pregnant state, incidental: Secondary | ICD-10-CM

## 2018-01-29 DIAGNOSIS — Z3A38 38 weeks gestation of pregnancy: Secondary | ICD-10-CM

## 2018-01-29 DIAGNOSIS — O09523 Supervision of elderly multigravida, third trimester: Secondary | ICD-10-CM

## 2018-01-29 LAB — POCT URINALYSIS DIPSTICK OB
Blood, UA: NEGATIVE
Glucose, UA: NEGATIVE
KETONES UA: NEGATIVE
Leukocytes, UA: NEGATIVE
NITRITE UA: NEGATIVE
PROTEIN: NEGATIVE

## 2018-01-29 NOTE — Progress Notes (Signed)
HIGH-RISK PREGNANCY VISIT Patient name: Mariah [name] MRN [id number]  Date of birth: 12/26/1975 Chief Complaint:   High Risk Gestation (NST/ a lot of pressure/ room # 12)  History of Present Illness:   Mariah [name] is a 42 y.o. G3P0030 female at [redacted]w[redacted]d with an Estimated Date of Delivery: 02/07/18 being seen today for ongoing management of a high-risk pregnancy complicated by AMA.  Today she reports pressure. Contractions: Not present.  .  Movement: Present. denies leaking of fluid.  Review of Systems:   Pertinent items are noted in HPI Denies abnormal vaginal discharge w/ itching/odor/irritation, headaches, visual changes, shortness of breath, chest pain, abdominal pain, severe nausea/vomiting, or problems with urination or bowel movements unless otherwise stated above.    Pertinent History Reviewed:  Medical & Surgical Hx:   Past Medical History:  Diagnosis Date  . Anxiety   . Chronic chest wall pain   . Headache    migraines  . Vertigo    Past Surgical History:  Procedure Laterality Date  . KNEE ARTHROSCOPY    . WISDOM TOOTH EXTRACTION     Family History  Problem Relation Age of Onset  . Heart disease Paternal Grandfather   . Heart attack Paternal Grandfather   . Diabetes Paternal Grandmother   . Hypertension Paternal Grandmother   . Asthma Paternal Grandmother   . Cancer Paternal Grandmother        SKIN   . Atrial fibrillation Paternal Grandmother        PACEMAKER  . Hypertension Father   . COPD Father   . Heart attack Father   . Diabetes Mother   . Hypertension Mother   . Mental illness Mother        bipolar  . Cancer Brother        skin  . Hypertension Brother   . Cancer Paternal Aunt        brain   . Cancer Paternal Uncle        bone    Current Outpatient Medications:  .  aspirin EC 81 MG tablet, Take 2 tablets (162 mg total) by mouth daily., Disp: 60 tablet, Rfl: 6 .  prenatal vitamin w/FE, FA (PRENATAL 1 + 1) 27-1 MG TABS tablet, Take 1 tablet by  mouth daily at 12 noon. (Patient taking differently: Take 1 tablet by mouth daily. ), Disp: 30 each, Rfl: 12 .  acetaminophen (TYLENOL) 325 MG tablet, Take 650 mg by mouth every 6 (six) hours as needed for moderate pain or headache. , Disp: , Rfl:  .  butalbital-acetaminophen-caffeine (FIORICET, ESGIC) 50-325-40 MG tablet, Take 1 tablet by mouth every 6 (six) hours as needed for headache. (Patient not taking: Reported on 01/28/2018), Disp: 20 tablet, Rfl: 0 Social History: Reviewed -  reports that she quit smoking about 3 years ago. Her smoking use included cigarettes. She has never used smokeless tobacco.   Physical Assessment:   Vitals:   01/29/18 0903  BP: 116/76  Pulse: (!) 123  Weight: 212 lb 6.4 oz (96.3 kg)  Body mass index is 31.83 kg/m.           Physical Examination:   General appearance: alert, well appearing, and in no distress  Mental status: alert, oriented to person, place, and time  Skin: warm & dry   Extremities: Edema: Trace    Cardiovascular: normal heart rate noted  Respiratory: normal respiratory effort, no distress  Abdomen: gravid, soft, non-tender  Pelvic: Cervical exam performed  Dilation: Closed Effacement (%):  Thick Station: Ballotable  Fetal Status: Fetal Heart Rate (bpm): 125   Movement: Present Presentation: Vertex  Fetal Surveillance Testing today: NST: FHR baseline 125 bpm, Variability: moderate, Accelerations:present, Decelerations:  Absent= Cat 1/Reactive   Results for orders placed or performed in visit on 01/29/18 (from the past 24 hour(s))  POC Urinalysis Dipstick OB   Collection Time: 01/29/18  9:05 AM  Result Value Ref Range   Color, UA     Clarity, UA     Glucose, UA Negative Negative   Bilirubin, UA     Ketones, UA neg    Spec Grav, UA     Blood, UA neg    pH, UA     POC Protein UA Negative Negative, Trace   Urobilinogen, UA     Nitrite, UA neg    Leukocytes, UA Negative Negative   Appearance     Odor      Assessment & Plan:   1) High-risk pregnancy G4P0030 at [redacted]w[redacted]d with an Estimated Date of Delivery: 02/07/18   2) AMA,   3) Suspected fetal macrosomia: EFW 99%; pt will labor if spontaneous, otherwise has PLTCS/BTL scheduled for 10/14 per [facility]   Treatment Plan: BPP on Friday  Follow-up: Return for As scheduled.  Orders Placed This Encounter  Procedures  . POC Urinalysis Dipstick OB   Jacklyn Shell CNM 01/29/2018 9:33 AM

## 2018-01-29 NOTE — Patient Instructions (Signed)
Mariah [name]  01/29/2018   Your procedure is scheduled on:  02/04/2018  Enter through the Main Entrance of [facility] at 0530 AM.  Pick up the phone at the desk and dial [phone]  Call this number if you have problems the morning of [phone]  Remember:   Do not eat food:(After Midnight) Desps de medianoche.  Do not drink clear liquids: (After Midnight) Desps de medianoche.  Take these medicines the morning of surgery with A SIP OF WATER: none   Do not wear jewelry, make-up or nail polish.  Do not wear lotions, powders, or perfumes. Do not wear deodorant.  Do not shave 48 hours prior to surgery.  Do not bring valuables to the hospital.  [facility] is not   responsible for any belongings or valuables brought to the hospital.  Contacts, dentures or bridgework may not be worn into surgery.  Leave suitcase in the car. After surgery it may be brought to your room.  For patients admitted to the hospital, checkout time is 11:00 AM the day of              discharge.    N/A   Please read over the following fact sheets that you were given:   Surgical Site Infection Prevention

## 2018-01-29 NOTE — Telephone Encounter (Signed)
Spoke to Amy and will get prep from [facility]. Instructions already given to patient on use.

## 2018-01-29 NOTE — Patient Instructions (Signed)

## 2018-01-29 NOTE — Telephone Encounter (Signed)
Preadmission screen  

## 2018-01-30 ENCOUNTER — Other Ambulatory Visit: Payer: Self-pay | Admitting: Obstetrics & Gynecology

## 2018-01-31 ENCOUNTER — Other Ambulatory Visit (HOSPITAL_COMMUNITY): Payer: Self-pay | Admitting: Advanced Practice Midwife

## 2018-01-31 DIAGNOSIS — O09523 Supervision of elderly multigravida, third trimester: Secondary | ICD-10-CM

## 2018-02-01 ENCOUNTER — Inpatient Hospital Stay (HOSPITAL_COMMUNITY)
Admission: AD | Admit: 2018-02-01 | Discharge: 2018-02-01 | Disposition: A | Discharge status: Home / Self Care | Payer: Medicaid Other | Source: Ambulatory Visit | Attending: Family Medicine | Admitting: Family Medicine

## 2018-02-01 ENCOUNTER — Encounter: Payer: Self-pay | Admitting: Women's Health

## 2018-02-01 ENCOUNTER — Encounter (HOSPITAL_COMMUNITY): Payer: Self-pay

## 2018-02-01 ENCOUNTER — Encounter (HOSPITAL_COMMUNITY)
Admission: RE | Admit: 2018-02-01 | Discharge: 2018-02-01 | Disposition: A | Discharge status: Home / Self Care | Payer: Medicaid Other | Source: Ambulatory Visit | Attending: Obstetrics & Gynecology | Admitting: Obstetrics & Gynecology

## 2018-02-01 ENCOUNTER — Ambulatory Visit (INDEPENDENT_AMBULATORY_CARE_PROVIDER_SITE_OTHER): Payer: Medicaid Other

## 2018-02-01 ENCOUNTER — Other Ambulatory Visit (HOSPITAL_COMMUNITY): Payer: Self-pay | Admitting: Advanced Practice Midwife

## 2018-02-01 ENCOUNTER — Ambulatory Visit (INDEPENDENT_AMBULATORY_CARE_PROVIDER_SITE_OTHER): Payer: Medicaid Other | Admitting: Women's Health

## 2018-02-01 VITALS — BP 157/85 | HR 111 | Wt 212.2 lb

## 2018-02-01 DIAGNOSIS — O133 Gestational [pregnancy-induced] hypertension without significant proteinuria, third trimester: Secondary | ICD-10-CM

## 2018-02-01 DIAGNOSIS — O09523 Supervision of elderly multigravida, third trimester: Secondary | ICD-10-CM

## 2018-02-01 DIAGNOSIS — Z87891 Personal history of nicotine dependence: Secondary | ICD-10-CM | POA: Insufficient documentation

## 2018-02-01 DIAGNOSIS — Z331 Pregnant state, incidental: Secondary | ICD-10-CM

## 2018-02-01 DIAGNOSIS — R030 Elevated blood-pressure reading, without diagnosis of hypertension: Secondary | ICD-10-CM | POA: Diagnosis not present

## 2018-02-01 DIAGNOSIS — O0990 Supervision of high risk pregnancy, unspecified, unspecified trimester: Secondary | ICD-10-CM

## 2018-02-01 DIAGNOSIS — Z880 Allergy status to penicillin: Secondary | ICD-10-CM | POA: Insufficient documentation

## 2018-02-01 DIAGNOSIS — Z3A39 39 weeks gestation of pregnancy: Secondary | ICD-10-CM | POA: Diagnosis not present

## 2018-02-01 DIAGNOSIS — O0993 Supervision of high risk pregnancy, unspecified, third trimester: Secondary | ICD-10-CM

## 2018-02-01 DIAGNOSIS — O3663X0 Maternal care for excessive fetal growth, third trimester, not applicable or unspecified: Secondary | ICD-10-CM | POA: Diagnosis present

## 2018-02-01 DIAGNOSIS — Z1389 Encounter for screening for other disorder: Secondary | ICD-10-CM

## 2018-02-01 DIAGNOSIS — O3663X1 Maternal care for excessive fetal growth, third trimester, fetus 1: Secondary | ICD-10-CM

## 2018-02-01 DIAGNOSIS — O9989 Other specified diseases and conditions complicating pregnancy, childbirth and the puerperium: Secondary | ICD-10-CM

## 2018-02-01 LAB — POCT URINALYSIS DIPSTICK OB
Blood, UA: NEGATIVE
Glucose, UA: NEGATIVE
Ketones, UA: NEGATIVE
Leukocytes, UA: NEGATIVE
Nitrite, UA: NEGATIVE

## 2018-02-01 LAB — PROTEIN / CREATININE RATIO, URINE
Creatinine, Urine: 56 mg/dL
Protein Creatinine Ratio: 0.16 mg/mg{Cre} — ABNORMAL HIGH (ref 0.00–0.15)
Total Protein, Urine: 9 mg/dL

## 2018-02-01 LAB — CBC
HCT: 38.4 % (ref 36.0–46.0)
Hemoglobin: 12.5 g/dL (ref 12.0–15.0)
MCH: 29.9 pg (ref 26.0–34.0)
MCHC: 32.6 g/dL (ref 30.0–36.0)
MCV: 91.9 fL (ref 80.0–100.0)
Platelets: 202 10*3/uL (ref 150–400)
RBC: 4.18 MIL/uL (ref 3.87–5.11)
RDW: 14.7 % (ref 11.5–15.5)
WBC: 8.5 10*3/uL (ref 4.0–10.5)
nRBC: 0 % (ref 0.0–0.2)

## 2018-02-01 LAB — COMPREHENSIVE METABOLIC PANEL
ALT: 18 U/L (ref 0–44)
AST: 22 U/L (ref 15–41)
Albumin: 2.9 g/dL — ABNORMAL LOW (ref 3.5–5.0)
Alkaline Phosphatase: 126 U/L (ref 38–126)
Anion gap: 9 (ref 5–15)
BUN: 8 mg/dL (ref 6–20)
CO2: 21 mmol/L — ABNORMAL LOW (ref 22–32)
Calcium: 9.4 mg/dL (ref 8.9–10.3)
Chloride: 105 mmol/L (ref 98–111)
Creatinine, Ser: 0.54 mg/dL (ref 0.44–1.00)
GFR calc Af Amer: >60 mL/min (ref >60)
GFR calc non Af Amer: >60 mL/min (ref >60)
Glucose, Bld: 88 mg/dL (ref 70–99)
Potassium: 4.3 mmol/L (ref 3.5–5.1)
Sodium: 135 mmol/L (ref 135–145)
Total Bilirubin: 0.6 mg/dL (ref 0.3–1.2)
Total Protein: 6.1 g/dL — ABNORMAL LOW (ref 6.5–8.1)

## 2018-02-01 NOTE — Progress Notes (Signed)
[place] 39+1 wks,cephalic,fhr 123 bpm,BPP 8/8,anterior placenta gr 3,afi 158 cm,RI .57,.53,.62=67%

## 2018-02-01 NOTE — MAU Note (Signed)
In [facility] this am, b/p was elevated and there was protein in her urine so they sent her here.

## 2018-02-01 NOTE — MAU Provider Note (Signed)
History     CSN: [id number]  Arrival date and time: 02/01/18 1100   None     Chief Complaint  Patient presents with  . Headache  . Hypertension   HPI This is a 42 year old G4, P0 at 39 weeks and 1 day.  She was sent to the MAU for preeclampsia work-up.  Pregnancy complicated by AMA and macrosomia.  Her blood pressures thus far have been normal.  Today her blood pressure was 140s over 90s in the office.  She denies headache, blurred vision, scotoma, right upper quadrant pain, nausea, vomiting.  OB History    Gravida  4   Para      Term      Preterm      AB  3   Living  0     SAB  3   TAB      Ectopic      Multiple      Live Births              Past Medical History:  Diagnosis Date  . Anxiety   . Chronic chest wall pain   . Headache    migraines  . Vertigo     Past Surgical History:  Procedure Laterality Date  . KNEE ARTHROSCOPY    . WISDOM TOOTH EXTRACTION      Family History  Problem Relation Age of Onset  . Heart disease Paternal Grandfather   . Heart attack Paternal Grandfather   . Diabetes Paternal Grandmother   . Hypertension Paternal Grandmother   . Asthma Paternal Grandmother   . Cancer Paternal Grandmother        SKIN   . Atrial fibrillation Paternal Grandmother        PACEMAKER  . Hypertension Father   . COPD Father   . Heart attack Father   . Diabetes Mother   . Hypertension Mother   . Mental illness Mother        bipolar  . Cancer Brother        skin  . Hypertension Brother   . Cancer Paternal Aunt        brain   . Cancer Paternal Uncle        bone    Social History   Tobacco Use  . Smoking status: Former Smoker    Types: Cigarettes    Last attempt to quit: 05/27/2014    Years since quitting: 3.6  . Smokeless tobacco: Never Used  Substance Use Topics  . Alcohol use: Not Currently    Frequency: Never  . Drug use: No    Allergies:  Allergies  Allergen Reactions  . Penicillins Hives and Other (See  Comments)    Has patient had a PCN reaction causing immediate rash, facial/tongue/throat swelling, SOB or lightheadedness with hypotension: Unknown Has patient had a PCN reaction causing severe rash involving mucus membranes or skin necrosis: Unknown Has patient had a PCN reaction that required hospitalization: Unknown Has patient had a PCN reaction occurring within the last 10 years: No If all of the above answers are "NO", then may proceed with Cephalosporin use.     Medications Prior to Admission  Medication Sig Dispense Refill Last Dose  . acetaminophen (TYLENOL) 325 MG tablet Take 650 mg by mouth every 6 (six) hours as needed for moderate pain or headache.    Taking  . aspirin EC 81 MG tablet Take 2 tablets (162 mg total) by mouth daily. 60 tablet 6 Taking  . butalbital-acetaminophen-caffeine (  FIORICET, ESGIC) 50-325-40 MG tablet Take 1 tablet by mouth every 6 (six) hours as needed for headache. 20 tablet 0 Taking  . prenatal vitamin w/FE, FA (PRENATAL 1 + 1) 27-1 MG TABS tablet Take 1 tablet by mouth daily at 12 noon. (Patient taking differently: Take 1 tablet by mouth daily. ) 30 each 12 Taking    Review of Systems Physical Exam   Blood pressure 125/86, pulse (!) 114, temperature 97.8 F (36.6 C), temperature source Oral, resp. rate 17, height 5\' 9"  (1.753 m), weight 96.2 kg, last menstrual period 05/03/2017, SpO2 99 %.  Patient Vitals for the past 24 hrs:  BP Temp Temp src Pulse Resp SpO2 Height Weight  02/01/18 1300 130/84 - - 94 - - - -  02/01/18 1245 137/88 - - 95 - - - -  02/01/18 1230 138/79 - - 92 - - - -  02/01/18 1215 130/79 - - 97 - - - -  02/01/18 1200 (!) 143/93 - - 100 - - - -  02/01/18 1145 130/87 - - 92 - - - -  02/01/18 1137 133/86 - - 93 - - - -  02/01/18 1118 125/86 97.8 F (36.6 C) Oral (!) 114 17 99 % 5\' 9"  (1.753 m) 96.2 kg     Physical Exam  Constitutional: She is oriented to person, place, and time. She appears well-developed and well-nourished.   HENT:  Head: Normocephalic and atraumatic.  Right Ear: External ear normal.  Left Ear: External ear normal.  Neck: Normal range of motion. Neck supple.  Cardiovascular: Normal rate, regular rhythm and normal heart sounds.  Respiratory: Effort normal and breath sounds normal.  GI: Soft. Bowel sounds are normal. She exhibits no distension and no mass. There is no tenderness. There is no rebound and no guarding.  Fundus greater than dates  Neurological: She is alert and oriented to person, place, and time.  Skin: Skin is warm and dry.  Psychiatric: She has a normal mood and affect. Her behavior is normal. Judgment and thought content normal.   Results for orders placed or performed during the hospital encounter of 02/01/18 (from the past 24 hour(s))  Protein / creatinine ratio, urine     Status: Abnormal   Collection Time: 02/01/18 11:27 AM  Result Value Ref Range   Creatinine, Urine 56.00 mg/dL   Total Protein, Urine 9 mg/dL   Protein Creatinine Ratio 0.16 (H) 0.00 - 0.15 mg/mg[Cre]  CBC     Status: None   Collection Time: 02/01/18 12:02 PM  Result Value Ref Range   WBC 8.5 4.0 - 10.5 K/uL   RBC 4.18 3.87 - 5.11 MIL/uL   Hemoglobin 12.5 12.0 - 15.0 g/dL   HCT 16.1 09.6 - 04.5 %   MCV 91.9 80.0 - 100.0 fL   MCH 29.9 26.0 - 34.0 pg   MCHC 32.6 30.0 - 36.0 g/dL   RDW 40.9 81.1 - 91.4 %   Platelets 202 150 - 400 K/uL   nRBC 0.0 0.0 - 0.2 %  Comprehensive metabolic panel     Status: Abnormal   Collection Time: 02/01/18 12:02 PM  Result Value Ref Range   Sodium 135 135 - 145 mmol/L   Potassium 4.3 3.5 - 5.1 mmol/L   Chloride 105 98 - 111 mmol/L   CO2 21 (L) 22 - 32 mmol/L   Glucose, Bld 88 70 - 99 mg/dL   BUN 8 6 - 20 mg/dL   Creatinine, Ser 7.82 0.44 - 1.00 mg/dL  Calcium 9.4 8.9 - 10.3 mg/dL   Total Protein 6.1 (L) 6.5 - 8.1 g/dL   Albumin 2.9 (L) 3.5 - 5.0 g/dL   AST 22 15 - 41 U/L   ALT 18 0 - 44 U/L   Alkaline Phosphatase 126 38 - 126 U/L   Total Bilirubin 0.6 0.3 -  1.2 mg/dL   GFR calc non Af Amer >60 >60 mL/min   GFR calc Af Amer >60 >60 mL/min   Anion gap 9 5 - 15     MAU Course  Procedures  MDM BPs normal  Assessment and Plan  1. [id number] 2. AMA 3. [redacted]w[redacted]d 4. LGA  Bps high normal, had one spuriously elevated BP while she was here, but returned to normal. Labs normal D/c to home. Has c/s on Monday. Return to care for headache, scotoma, abdominal pain, decreased fetal movement.  Levie Heritage 02/01/2018, 11:30 AM

## 2018-02-01 NOTE — Discharge Instructions (Signed)

## 2018-02-01 NOTE — Progress Notes (Signed)
HIGH-RISK PREGNANCY VISIT Patient name: Mariah [name] MRN [id number]  Date of birth: 1975-05-27 Chief Complaint:   High Risk Gestation (bpp/back hurt)  History of Present Illness:   Mariah [name] is a 42 y.o. G59P0030 female at 104w1d with an Estimated Date of Delivery: 02/07/18 being seen today for ongoing management of a high-risk pregnancy complicated by AMA @ 42yo, suspected LGA 99%.  Today she reports intermittent lower back pain, did a lot of walking yesterday- doesn't feel like contractions. .BP elevated today w/ tr proteinuria. Denies headaches, did see spots x1 last Sat while sitting down, none since. Denies RUQ/epigastric pain, n/v. States at her visit last week w/ Dr. Despina Hidden they decided on PLTCS d/t suspected fetal macrosomia, scheduled for Monday w/ BTL.  Contractions: Not present.  .  Movement: Present. denies leaking of fluid.  Review of Systems:   Pertinent items are noted in HPI Denies abnormal vaginal discharge w/ itching/odor/irritation, headaches, visual changes, shortness of breath, chest pain, abdominal pain, severe nausea/vomiting, or problems with urination or bowel movements unless otherwise stated above. Pertinent History Reviewed:  Reviewed past medical,surgical, social, obstetrical and family history.  Reviewed problem list, medications and allergies. Physical Assessment:   Vitals:   02/01/18 0944  BP: (!) 157/85  Pulse: (!) 111  Weight: 212 lb 3.2 oz (96.3 kg)  Body mass index is 31.34 kg/m.           Physical Examination:   General appearance: alert, well appearing, and in no distress  Mental status: alert, oriented to person, place, and time  Skin: warm & dry   Extremities: Edema: Trace    Cardiovascular: normal heart rate noted  Respiratory: normal respiratory effort, no distress  Abdomen: gravid, soft, non-tender  Pelvic: Cervical exam deferred         Fetal Status: Fetal Heart Rate (bpm): 123 u/s Fundal Height: 38 cm Movement: Present     Fetal Surveillance Testing today: [place] 39+1 wks,cephalic,fhr 123 bpm,BPP 8/8,anterior placenta gr 3,afi 158 cm,RI .57,.53,.62=67%   Results for orders placed or performed in visit on 02/01/18 (from the past 24 hour(s))  POC Urinalysis Dipstick OB   Collection Time: 02/01/18  9:48 AM  Result Value Ref Range   Color, UA     Clarity, UA     Glucose, UA Negative Negative   Bilirubin, UA     Ketones, UA neg    Spec Grav, UA     Blood, UA neg    pH, UA     POC Protein UA Trace Negative, Trace   Urobilinogen, UA     Nitrite, UA neg    Leukocytes, UA Negative Negative   Appearance     Odor      Assessment & Plan:  1) High-risk pregnancy G4P0030 at [redacted]w[redacted]d with an Estimated Date of Delivery: 02/07/18   2) AMA, 42yo  3) Suspected LGA, EFW 99%/3767g @ 36wks, plans PLTCS w/ BTL 10/14  4) New onset HTN w/ tr proteinuria> to MAU for eval, notified Dr.Wallace  Meds: No orders of the defined types were placed in this encounter.   Labs/procedures today: bpp/dopp u/s  Treatment Plan:  To [facility]  Reviewed: Term labor symptoms and general obstetric precautions including but not limited to vaginal bleeding, contractions, leaking of fluid and fetal movement were reviewed in detail with the patient.  All questions were answered.  Follow-up: Return for on 10/18 for pp bp check.  Orders Placed This Encounter  Procedures  . POC Urinalysis Dipstick  OB   Cheral Marker CNM, [facility] 02/01/2018 10:20 AM

## 2018-02-03 MED ORDER — GENTAMICIN SULFATE 40 MG/ML IJ SOLN
INTRAVENOUS | Status: AC
  Administered 2018-02-04: 900 mg via INTRAVENOUS
  Filled 2018-02-03: qty 9.75

## 2018-02-03 NOTE — Anesthesia Preprocedure Evaluation (Addendum)
Anesthesia Evaluation  Patient identified by MRN, date of birth, ID band Patient awake    Reviewed: Allergy & Precautions, NPO status , Patient's Chart, lab work & pertinent test results  History of Anesthesia Complications Negative for: history of anesthetic complications  Airway Mallampati: II  TM Distance: >3 FB Neck ROM: Full    Dental no notable dental hx. (+) Teeth Intact   Pulmonary neg pulmonary ROS, former smoker,    Pulmonary exam normal breath sounds clear to auscultation       Cardiovascular negative cardio ROS Normal cardiovascular exam Rhythm:Regular Rate:Normal     Neuro/Psych  Headaches, Anxiety    GI/Hepatic negative GI ROS, Neg liver ROS,   Endo/Other  negative endocrine ROS  Renal/GU negative Renal ROS     Musculoskeletal negative musculoskeletal ROS (+)   Abdominal   Peds  Hematology negative hematology ROS (+)   Anesthesia Other Findings Day of surgery medications reviewed with the patient.  Reproductive/Obstetrics (+) Pregnancy                            Anesthesia Physical Anesthesia Plan  ASA: II  Anesthesia Plan: Spinal   Post-op Pain Management:    Induction:   PONV Risk Score and Plan: 2 and Ondansetron, Dexamethasone and Treatment may vary due to age or medical condition  Airway Management Planned: Natural Airway  Additional Equipment:   Intra-op Plan:   Post-operative Plan:   Informed Consent: I have reviewed the patients History and Physical, chart, labs and discussed the procedure including the risks, benefits and alternatives for the proposed anesthesia with the patient or authorized representative who has indicated his/her understanding and acceptance.     Plan Discussed with: CRNA  Anesthesia Plan Comments:        Anesthesia Quick Evaluation

## 2018-02-04 ENCOUNTER — Encounter (HOSPITAL_COMMUNITY): Payer: Self-pay

## 2018-02-04 ENCOUNTER — Inpatient Hospital Stay (HOSPITAL_COMMUNITY): Payer: Medicaid Other | Admitting: Anesthesiology

## 2018-02-04 ENCOUNTER — Inpatient Hospital Stay (HOSPITAL_COMMUNITY)
Admission: RE | Admit: 2018-02-04 | Discharge: 2018-02-06 | DRG: 785 | Disposition: A | Discharge status: Home / Self Care | Payer: Medicaid Other | Attending: Obstetrics & Gynecology | Admitting: Obstetrics & Gynecology

## 2018-02-04 ENCOUNTER — Telehealth: Payer: Self-pay | Admitting: *Deleted

## 2018-02-04 ENCOUNTER — Encounter (HOSPITAL_COMMUNITY)
Admission: RE | Disposition: A | Discharge status: Home / Self Care | Payer: Self-pay | Source: Home / Self Care | Attending: Obstetrics & Gynecology

## 2018-02-04 DIAGNOSIS — Z98891 History of uterine scar from previous surgery: Secondary | ICD-10-CM

## 2018-02-04 DIAGNOSIS — Z880 Allergy status to penicillin: Secondary | ICD-10-CM | POA: Diagnosis not present

## 2018-02-04 DIAGNOSIS — Z3A39 39 weeks gestation of pregnancy: Secondary | ICD-10-CM

## 2018-02-04 DIAGNOSIS — Z302 Encounter for sterilization: Secondary | ICD-10-CM

## 2018-02-04 DIAGNOSIS — Z87891 Personal history of nicotine dependence: Secondary | ICD-10-CM | POA: Diagnosis not present

## 2018-02-04 DIAGNOSIS — O3663X0 Maternal care for excessive fetal growth, third trimester, not applicable or unspecified: Secondary | ICD-10-CM

## 2018-02-04 DIAGNOSIS — O34211 Maternal care for low transverse scar from previous cesarean delivery: Secondary | ICD-10-CM | POA: Diagnosis not present

## 2018-02-04 LAB — CBC
HEMATOCRIT: 37.8 % (ref 36.0–46.0)
HEMOGLOBIN: 12.7 g/dL (ref 12.0–15.0)
MCH: 30.7 pg (ref 26.0–34.0)
MCHC: 33.6 g/dL (ref 30.0–36.0)
MCV: 91.3 fL (ref 80.0–100.0)
Platelets: 176 10*3/uL (ref 150–400)
RBC: 4.14 MIL/uL (ref 3.87–5.11)
RDW: 14.7 % (ref 11.5–15.5)
WBC: 10 10*3/uL (ref 4.0–10.5)
nRBC: 0 % (ref 0.0–0.2)

## 2018-02-04 LAB — CREATININE, SERUM
Creatinine, Ser: 0.41 mg/dL — ABNORMAL LOW (ref 0.44–1.00)
GFR calc Af Amer: >60 mL/min (ref >60)
GFR calc non Af Amer: >60 mL/min (ref >60)

## 2018-02-04 LAB — ABO/RH: ABO/RH(D): A POS

## 2018-02-04 LAB — TYPE AND SCREEN
ABO/RH(D): A POS
Antibody Screen: NEGATIVE

## 2018-02-04 SURGERY — Surgical Case
Anesthesia: Spinal

## 2018-02-04 MED ORDER — DEXAMETHASONE SODIUM PHOSPHATE 4 MG/ML IJ SOLN
INTRAMUSCULAR | Status: AC
  Filled 2018-02-04: qty 1

## 2018-02-04 MED ORDER — SIMETHICONE 80 MG PO CHEW
80.0000 mg | CHEWABLE_TABLET | ORAL | Status: DC
  Administered 2018-02-05 – 2018-02-06 (×2): 80 mg via ORAL
  Filled 2018-02-04 (×2): qty 1

## 2018-02-04 MED ORDER — OXYTOCIN 40 UNITS IN LACTATED RINGERS INFUSION - SIMPLE MED: 2.5000 [IU]/h | INTRAVENOUS | Status: AC

## 2018-02-04 MED ORDER — LACTATED RINGERS IV SOLN
INTRAVENOUS | Status: DC
  Administered 2018-02-04 (×3): via INTRAVENOUS

## 2018-02-04 MED ORDER — COCONUT OIL OIL: 1.0000 "application " | TOPICAL_OIL | Status: DC | PRN

## 2018-02-04 MED ORDER — KETOROLAC TROMETHAMINE 30 MG/ML IJ SOLN
INTRAMUSCULAR | Status: AC
  Filled 2018-02-04: qty 1

## 2018-02-04 MED ORDER — MEASLES, MUMPS & RUBELLA VAC ~~LOC~~ INJ
0.5000 mL | INJECTION | Freq: Once | SUBCUTANEOUS | Status: DC
  Filled 2018-02-04: qty 0.5

## 2018-02-04 MED ORDER — MORPHINE SULFATE (PF) 0.5 MG/ML IJ SOLN
INTRAMUSCULAR | Status: AC
  Filled 2018-02-04: qty 10

## 2018-02-04 MED ORDER — METHYLERGONOVINE MALEATE 0.2 MG PO TABS
0.2000 mg | ORAL_TABLET | Freq: Three times a day (TID) | ORAL | Status: AC
  Administered 2018-02-04 – 2018-02-05 (×3): 0.2 mg via ORAL
  Filled 2018-02-04 (×3): qty 1

## 2018-02-04 MED ORDER — NALBUPHINE HCL 10 MG/ML IJ SOLN
5.0000 mg | Freq: Once | INTRAMUSCULAR | Status: AC | PRN
  Administered 2018-02-04: 5 mg via SUBCUTANEOUS

## 2018-02-04 MED ORDER — DIPHENHYDRAMINE HCL 25 MG PO CAPS: 25.0000 mg | ORAL_CAPSULE | Freq: Four times a day (QID) | ORAL | Status: DC | PRN

## 2018-02-04 MED ORDER — MENTHOL 3 MG MT LOZG: 1.0000 | LOZENGE | OROMUCOSAL | Status: DC | PRN

## 2018-02-04 MED ORDER — METHYLERGONOVINE MALEATE 0.2 MG/ML IJ SOLN: 0.2000 mg | Freq: Once | INTRAMUSCULAR | Status: DC

## 2018-02-04 MED ORDER — LACTATED RINGERS IV SOLN
INTRAVENOUS | Status: DC
  Administered 2018-02-04 (×2): 125 mL/h via INTRAVENOUS

## 2018-02-04 MED ORDER — OXYCODONE HCL 5 MG PO TABS
10.0000 mg | ORAL_TABLET | ORAL | Status: DC | PRN
  Administered 2018-02-05 – 2018-02-06 (×6): 10 mg via ORAL
  Filled 2018-02-04 (×6): qty 2

## 2018-02-04 MED ORDER — NALBUPHINE HCL 10 MG/ML IJ SOLN: 5.0000 mg | Freq: Once | INTRAMUSCULAR | Status: AC | PRN

## 2018-02-04 MED ORDER — PROMETHAZINE HCL 25 MG/ML IJ SOLN: 6.2500 mg | INTRAMUSCULAR | Status: DC | PRN

## 2018-02-04 MED ORDER — SODIUM CHLORIDE 0.9 % IR SOLN
Status: DC | PRN
  Administered 2018-02-04: 1

## 2018-02-04 MED ORDER — OXYCODONE HCL 5 MG PO TABS
5.0000 mg | ORAL_TABLET | ORAL | Status: DC | PRN
  Administered 2018-02-04: 5 mg via ORAL
  Filled 2018-02-04: qty 1

## 2018-02-04 MED ORDER — KETOROLAC TROMETHAMINE 30 MG/ML IJ SOLN
30.0000 mg | Freq: Once | INTRAMUSCULAR | Status: AC | PRN
  Administered 2018-02-04: 30 mg via INTRAVENOUS

## 2018-02-04 MED ORDER — PRENATAL MULTIVITAMIN CH
1.0000 | ORAL_TABLET | Freq: Every day | ORAL | Status: DC
  Administered 2018-02-05 – 2018-02-06 (×2): 1 via ORAL
  Filled 2018-02-04 (×2): qty 1

## 2018-02-04 MED ORDER — ONDANSETRON HCL 4 MG/2ML IJ SOLN
INTRAMUSCULAR | Status: DC | PRN
  Administered 2018-02-04: 4 mg via INTRAVENOUS

## 2018-02-04 MED ORDER — SCOPOLAMINE 1 MG/3DAYS TD PT72
MEDICATED_PATCH | TRANSDERMAL | Status: AC
  Filled 2018-02-04: qty 1

## 2018-02-04 MED ORDER — FENTANYL CITRATE (PF) 100 MCG/2ML IJ SOLN: 25.0000 ug | INTRAMUSCULAR | Status: DC | PRN

## 2018-02-04 MED ORDER — SENNOSIDES-DOCUSATE SODIUM 8.6-50 MG PO TABS
2.0000 | ORAL_TABLET | ORAL | Status: DC
  Administered 2018-02-05 – 2018-02-06 (×2): 2 via ORAL
  Filled 2018-02-04 (×2): qty 2

## 2018-02-04 MED ORDER — TETANUS-DIPHTH-ACELL PERTUSSIS 5-2.5-18.5 LF-MCG/0.5 IM SUSP: 0.5000 mL | Freq: Once | INTRAMUSCULAR | Status: DC

## 2018-02-04 MED ORDER — NALBUPHINE HCL 10 MG/ML IJ SOLN
INTRAMUSCULAR | Status: AC
  Filled 2018-02-04: qty 1

## 2018-02-04 MED ORDER — PHENYLEPHRINE 8 MG IN D5W 100 ML (0.08MG/ML) PREMIX OPTIME
INJECTION | INTRAVENOUS | Status: AC
  Filled 2018-02-04: qty 100

## 2018-02-04 MED ORDER — PHENYLEPHRINE 8 MG IN D5W 100 ML (0.08MG/ML) PREMIX OPTIME
INJECTION | INTRAVENOUS | Status: DC | PRN
  Administered 2018-02-04: 60 ug/min via INTRAVENOUS

## 2018-02-04 MED ORDER — FENTANYL CITRATE (PF) 100 MCG/2ML IJ SOLN
INTRAMUSCULAR | Status: AC
  Filled 2018-02-04: qty 2

## 2018-02-04 MED ORDER — METHYLERGONOVINE MALEATE 0.2 MG/ML IJ SOLN
INTRAMUSCULAR | Status: AC
  Administered 2018-02-04: 0.2 mg
  Filled 2018-02-04: qty 1

## 2018-02-04 MED ORDER — DEXAMETHASONE SODIUM PHOSPHATE 10 MG/ML IJ SOLN
INTRAMUSCULAR | Status: AC
  Filled 2018-02-04: qty 1

## 2018-02-04 MED ORDER — ONDANSETRON HCL 4 MG/2ML IJ SOLN
INTRAMUSCULAR | Status: AC
  Filled 2018-02-04: qty 2

## 2018-02-04 MED ORDER — FENTANYL CITRATE (PF) 100 MCG/2ML IJ SOLN
INTRAMUSCULAR | Status: DC | PRN
  Administered 2018-02-04: 15 ug via INTRATHECAL

## 2018-02-04 MED ORDER — NALOXONE HCL 0.4 MG/ML IJ SOLN: 0.4000 mg | INTRAMUSCULAR | Status: DC | PRN

## 2018-02-04 MED ORDER — OXYTOCIN 10 UNIT/ML IJ SOLN
INTRAMUSCULAR | Status: AC
  Filled 2018-02-04: qty 4

## 2018-02-04 MED ORDER — ACETAMINOPHEN 325 MG PO TABS
650.0000 mg | ORAL_TABLET | ORAL | Status: DC | PRN
  Administered 2018-02-04 (×2): 650 mg via ORAL
  Filled 2018-02-04 (×2): qty 2

## 2018-02-04 MED ORDER — SOD CITRATE-CITRIC ACID 500-334 MG/5ML PO SOLN
ORAL | Status: AC
  Filled 2018-02-04: qty 15

## 2018-02-04 MED ORDER — NALBUPHINE HCL 10 MG/ML IJ SOLN: 5.0000 mg | INTRAMUSCULAR | Status: DC | PRN

## 2018-02-04 MED ORDER — ONDANSETRON HCL 4 MG/2ML IJ SOLN: 4.0000 mg | Freq: Three times a day (TID) | INTRAMUSCULAR | Status: DC | PRN

## 2018-02-04 MED ORDER — SODIUM CHLORIDE 0.9% FLUSH: 3.0000 mL | INTRAVENOUS | Status: DC | PRN

## 2018-02-04 MED ORDER — BUPIVACAINE IN DEXTROSE 0.75-8.25 % IT SOLN
INTRATHECAL | Status: DC | PRN
  Administered 2018-02-04: 1.7 mL via INTRATHECAL

## 2018-02-04 MED ORDER — ENOXAPARIN SODIUM 60 MG/0.6ML ~~LOC~~ SOLN
50.0000 mg | SUBCUTANEOUS | Status: DC
  Administered 2018-02-05: 10:00:00 via SUBCUTANEOUS
  Administered 2018-02-06: 50 mg via SUBCUTANEOUS
  Filled 2018-02-04 (×3): qty 0.6

## 2018-02-04 MED ORDER — SIMETHICONE 80 MG PO CHEW: 80.0000 mg | CHEWABLE_TABLET | ORAL | Status: DC | PRN

## 2018-02-04 MED ORDER — WITCH HAZEL-GLYCERIN EX PADS: 1.0000 "application " | MEDICATED_PAD | CUTANEOUS | Status: DC | PRN

## 2018-02-04 MED ORDER — LACTATED RINGERS IV SOLN
INTRAVENOUS | Status: DC | PRN
  Administered 2018-02-04: 08:00:00 via INTRAVENOUS

## 2018-02-04 MED ORDER — NALOXONE HCL 4 MG/10ML IJ SOLN
1.0000 ug/kg/h | INTRAVENOUS | Status: DC | PRN
  Filled 2018-02-04: qty 5

## 2018-02-04 MED ORDER — MORPHINE SULFATE (PF) 0.5 MG/ML IJ SOLN
INTRAMUSCULAR | Status: DC | PRN
  Administered 2018-02-04: .15 mg via INTRATHECAL

## 2018-02-04 MED ORDER — ZOLPIDEM TARTRATE 5 MG PO TABS: 5.0000 mg | ORAL_TABLET | Freq: Every evening | ORAL | Status: DC | PRN

## 2018-02-04 MED ORDER — IBUPROFEN 600 MG PO TABS
600.0000 mg | ORAL_TABLET | Freq: Four times a day (QID) | ORAL | Status: DC
  Administered 2018-02-04 – 2018-02-06 (×8): 600 mg via ORAL
  Filled 2018-02-04 (×8): qty 1

## 2018-02-04 MED ORDER — OXYTOCIN 10 UNIT/ML IJ SOLN
INTRAVENOUS | Status: DC | PRN
  Administered 2018-02-04: 40 [IU] via INTRAVENOUS

## 2018-02-04 MED ORDER — DEXAMETHASONE SODIUM PHOSPHATE 4 MG/ML IJ SOLN
INTRAMUSCULAR | Status: DC | PRN
  Administered 2018-02-04: 4 mg via INTRAVENOUS

## 2018-02-04 MED ORDER — DIBUCAINE 1 % RE OINT: 1.0000 "application " | TOPICAL_OINTMENT | RECTAL | Status: DC | PRN

## 2018-02-04 MED ORDER — SIMETHICONE 80 MG PO CHEW
80.0000 mg | CHEWABLE_TABLET | Freq: Three times a day (TID) | ORAL | Status: DC
  Administered 2018-02-04 – 2018-02-06 (×5): 80 mg via ORAL
  Filled 2018-02-04 (×5): qty 1

## 2018-02-04 SURGICAL SUPPLY — 36 items
CLAMP CORD UMBIL (MISCELLANEOUS) IMPLANT
CLOTH BEACON ORANGE TIMEOUT ST (SAFETY) ×3 IMPLANT
DERMABOND ADVANCED (GAUZE/BANDAGES/DRESSINGS) ×4
DERMABOND ADVANCED .7 DNX12 (GAUZE/BANDAGES/DRESSINGS) ×2 IMPLANT
DRSG OPSITE POSTOP 4X10 (GAUZE/BANDAGES/DRESSINGS) ×3 IMPLANT
ELECT REM PT RETURN 9FT ADLT (ELECTROSURGICAL) ×3
ELECTRODE REM PT RTRN 9FT ADLT (ELECTROSURGICAL) ×1 IMPLANT
EXTRACTOR VACUUM BELL STYLE (SUCTIONS) IMPLANT
GLOVE BIOGEL PI IND STRL 7.0 (GLOVE) ×1 IMPLANT
GLOVE BIOGEL PI IND STRL 8 (GLOVE) ×1 IMPLANT
GLOVE BIOGEL PI INDICATOR 7.0 (GLOVE) ×2
GLOVE BIOGEL PI INDICATOR 8 (GLOVE) ×2
GLOVE ECLIPSE 8.0 STRL XLNG CF (GLOVE) ×3 IMPLANT
GOWN STRL REUS W/TWL LRG LVL3 (GOWN DISPOSABLE) ×6 IMPLANT
KIT ABG SYR 3ML LUER SLIP (SYRINGE) ×3 IMPLANT
NEEDLE HYPO 18GX1.5 BLUNT FILL (NEEDLE) ×3 IMPLANT
NEEDLE HYPO 22GX1.5 SAFETY (NEEDLE) ×3 IMPLANT
NEEDLE HYPO 25X5/8 SAFETYGLIDE (NEEDLE) ×3 IMPLANT
NS IRRIG 1000ML POUR BTL (IV SOLUTION) ×3 IMPLANT
PACK C SECTION WH (CUSTOM PROCEDURE TRAY) ×3 IMPLANT
PAD OB MATERNITY 4.3X12.25 (PERSONAL CARE ITEMS) ×3 IMPLANT
PENCIL SMOKE EVAC W/HOLSTER (ELECTROSURGICAL) ×3 IMPLANT
RTRCTR C-SECT PINK 25CM LRG (MISCELLANEOUS) IMPLANT
SPONGE LAP 18X18 RF (DISPOSABLE) ×9 IMPLANT
SUT CHROMIC 0 CT 1 (SUTURE) ×3 IMPLANT
SUT MNCRL 0 VIOLET CTX 36 (SUTURE) ×2 IMPLANT
SUT MONOCRYL 0 CTX 36 (SUTURE) ×4
SUT PLAIN 2 0 (SUTURE) ×4
SUT PLAIN 2 0 XLH (SUTURE) IMPLANT
SUT PLAIN ABS 2-0 CT1 27XMFL (SUTURE) ×2 IMPLANT
SUT VIC AB 0 CTX 36 (SUTURE) ×2
SUT VIC AB 0 CTX36XBRD ANBCTRL (SUTURE) ×1 IMPLANT
SUT VIC AB 4-0 KS 27 (SUTURE) IMPLANT
SYR 20CC LL (SYRINGE) ×6 IMPLANT
TOWEL OR 17X24 6PK STRL BLUE (TOWEL DISPOSABLE) ×3 IMPLANT
TRAY FOLEY W/BAG SLVR 14FR LF (SET/KITS/TRAYS/PACK) IMPLANT

## 2018-02-04 NOTE — Anesthesia Procedure Notes (Signed)
Spinal  Patient location during procedure: OR Start time: 02/04/2018 7:20 AM End time: 02/04/2018 7:22 AM Staffing Anesthesiologist: Kaylyn Layer, MD Performed: anesthesiologist  Preanesthetic Checklist Completed: patient identified, site marked, pre-op evaluation, timeout performed, IV checked, risks and benefits discussed and monitors and equipment checked Spinal Block Patient position: sitting Prep: ChloraPrep Patient monitoring: heart rate, cardiac monitor and continuous pulse ox Approach: midline Location: L3-4 Injection technique: single-shot Needle Needle type: Pencan  Needle gauge: 24 G Needle length: 10 cm Additional Notes Risks, benefits, and alternative discussed. Patient gave consent to procedure. Prepped and draped in sitting position. Clear CSF obtained after one needle pass. Positive terminal aspiration. No pain or paraesthesias with injection. Patient tolerated procedure well. Vital signs stable. Amalia Greenhouse, MD

## 2018-02-04 NOTE — Telephone Encounter (Signed)
Lmom for pt to call us back to schedule post op and pp appt.  02-04-18  AS

## 2018-02-04 NOTE — Op Note (Addendum)
Cesarean Section Procedure Note   JEROLINE WOLBERT  02/04/2018  Indications: Scheduled Proceedure/Maternal Request   Pre-operative Diagnosis: primary c/section with BTL, fetal macrosomia.   Post-operative Diagnosis: Same   Surgeon: Surgeon(s) and Role:    * Jacon Whetzel, Amaryllis Dyke, MD - Primary    * Gwenevere Abbot, MD - Fellow   Assistants: Gwenevere Abbot, MD - Fellow  Anesthesia: epidural    Estimated Blood Loss: 302 mL   Total IV Fluids:   Urine Output: 150CC OF clear urine  Specimens: bilateral fallopian tube segments  Findings: pending  Baby condition / location:  Couplet care / Skin to Skin  APGAR: 9, 9; weight pending  .     Complications: no complications  Indications: Mariah [name] is a 42 y.o. [phone] with an IUP [redacted]w[redacted]d presenting with scheduled csection.  The risks, benefits, complications, treatment options, and expected outcomes were discussed with the patient . The patient concurred with the proposed plan, giving informed consent. identified as Alferd Apa and the procedure verified as C-Section Delivery.  Procedure Details: A Time Out was held and the above information confirmed.  The patient was taken back to the operative suite where epidural anesthesia was placed.  After induction of anesthesia, the patient was draped and prepped in the usual sterile manner and placed in a dorsal supine position with a leftward tilt. A transverse was made and carried down through the subcutaneous tissue to the fascia. Fascial incision was made and extended transversely. The fascia was separated from the underlying rectus tissue superiorly and inferiorly. The peritoneum was identified and entered. Peritoneal incision was extended longitudinally. The utero-vesical peritoneal reflection was incised transversely and the bladder flap was bluntly freed from the lower uterine segment. A low transverse uterine incision was made. Delivered from cephalic presentation was a  Living newborn infant(s) or Female with Apgar scores of 9 at one minute and 9 at five minutes. Cord ph was sent and was 7.18 the umbilical cord was clamped and cut cord blood was obtained for evaluation. The placenta was removed Intact and appeared normal. The uterine outline, tubes and ovaries appeared normal}. The uterine incision was closed with running locked sutures of 0 monocryl. An imbricating layer was also placed.  Hemostasis was observed. Attention was turned to the patient's left fallopian tube, which was then identified, brought to the incision, and grasped with a Babcock clamp. The tube was then followed out to the fimbria. The Babcock clamp was then used to grasp the tube approximately 4 cm from the cornual region. A 3 cm segment of the tube was then ligated with free tie of plain gut suture, transected and excised. Good hemostasis was noted and the tube was returned to the abdomen. The right fallopian tube was then identified to its fimbriated end, ligated, and a 3 cm segment excised in a similar fashion. Excellent hemostasis was noted, and the tube returned to the abdomen.Lavage was carried out until clear. The fascia was then reapproximated with running sutures of 0Vicryl.  The skin was closed with 4-0Vicryl I a subcuticular manner.  Dermabond was used as well  Instrument, sponge, and needle counts were correct prior the abdominal closure and were correct at the conclusion of the case.     Disposition: PACU - hemodynamically stable.   Maternal Condition: stable    KAM KUSHNIR 02/04/2018 8:55 AM   Signed: Cleda Clarks PhillipMD 02/04/2018 8:47 AM

## 2018-02-04 NOTE — Anesthesia Postprocedure Evaluation (Signed)
Anesthesia Post Note  Patient: Mariah [name]  Procedure(s) Performed: CESAREAN SECTION WITH BILATERAL TUBAL LIGATION (N/A )     Patient location during evaluation: Mother Baby Anesthesia Type: Spinal Level of consciousness: awake, awake and alert and oriented Pain management: pain level controlled Vital Signs Assessment: post-procedure vital signs reviewed and stable Respiratory status: spontaneous breathing and respiratory function stable Cardiovascular status: blood pressure returned to baseline Postop Assessment: no headache, no backache, spinal receding, patient able to bend at knees, no apparent nausea or vomiting and adequate PO intake Anesthetic complications: no    Last Vitals:  Vitals:   02/04/18 1210 02/04/18 1315  BP: 129/72 119/76  Pulse: 93 91  Resp: 16 20  Temp: (!) 36.4 C   SpO2: 96% 97%    Last Pain:  Vitals:   02/04/18 1315  TempSrc:   PainSc: 2    Pain Goal:                 Cleda Clarks

## 2018-02-04 NOTE — Addendum Note (Signed)
Addendum  created 02/04/18 1404 by Cleda Clarks, CRNA   Sign clinical note

## 2018-02-04 NOTE — Anesthesia Postprocedure Evaluation (Signed)
Anesthesia Post Note  Patient: Mariah [name]  Procedure(s) Performed: CESAREAN SECTION WITH BILATERAL TUBAL LIGATION (N/A )     Patient location during evaluation: PACU Anesthesia Type: Spinal Level of consciousness: awake and alert Pain management: pain level controlled Vital Signs Assessment: post-procedure vital signs reviewed and stable Respiratory status: spontaneous breathing, nonlabored ventilation and respiratory function stable Cardiovascular status: blood pressure returned to baseline and stable Postop Assessment: no apparent nausea or vomiting Anesthetic complications: no    Last Vitals:  Vitals:   02/04/18 0517 02/04/18 0846  BP: 109/77 110/60  Pulse: 66 86  Resp: 18   Temp: 36.4 C   SpO2:  98%    Last Pain:  Vitals:   02/04/18 0846  TempSrc:   PainSc: (P) 0-No pain   Pain Goal:                 Kaylyn Layer

## 2018-02-04 NOTE — H&P (Addendum)
LABOR AND DELIVERY ADMISSION HISTORY AND PHYSICAL NOTE  Mariah [name] is a 42 y.o. female G58P0030 with IUP at [redacted]w[redacted]d by LMP/10w [place] presenting for pLTCS due to LGA.  She reports positive fetal movement. She denies leakage of fluid or vaginal bleeding.  Prenatal History/Complications: PNC at [facility] Pregnancy complications:  - AMA - Hx of multiple miscarriages - low lying placenta  Past Medical History: Past Medical History:  Diagnosis Date  . Anxiety    takes no meds  . Chronic chest wall pain   . Headache    migraines  . Vertigo     Past Surgical History: Past Surgical History:  Procedure Laterality Date  . KNEE ARTHROSCOPY    . WISDOM TOOTH EXTRACTION      Obstetrical History: OB History    Gravida  4   Para      Term      Preterm      AB  3   Living  0     SAB  3   TAB      Ectopic      Multiple      Live Births              Social History: Social History   Socioeconomic History  . Marital status: Married    Spouse name: Not on file  . Number of children: Not on file  . Years of education: Not on file  . Highest education level: Not on file  Occupational History  . Not on file  Social Needs  . Financial resource strain: Not hard at all  . Food insecurity:    Worry: Never true    Inability: Never true  . Transportation needs:    Medical: No    Non-medical: Not on file  Tobacco Use  . Smoking status: Former Smoker    Types: Cigarettes    Last attempt to quit: 05/27/2014    Years since quitting: 3.6  . Smokeless tobacco: Never Used  Substance and Sexual Activity  . Alcohol use: Not Currently    Frequency: Never  . Drug use: No  . Sexual activity: Yes    Birth control/protection: None  Lifestyle  . Physical activity:    Days per week: Not on file    Minutes per session: Not on file  . Stress: Only a little  Relationships  . Social connections:    Talks on phone: Not on file    Gets together: Not on file    Attends religious  service: Not on file    Active member of club or organization: Not on file    Attends meetings of clubs or organizations: Not on file    Relationship status: Not on file  Other Topics Concern  . Not on file  Social History Narrative  . Not on file    Family History: Family History  Problem Relation Age of Onset  . Heart disease Paternal Grandfather   . Heart attack Paternal Grandfather   . Diabetes Paternal Grandmother   . Hypertension Paternal Grandmother   . Asthma Paternal Grandmother   . Cancer Paternal Grandmother        SKIN   . Atrial fibrillation Paternal Grandmother        PACEMAKER  . Hypertension Father   . COPD Father   . Heart attack Father   . Diabetes Mother   . Hypertension Mother   . Mental illness Mother        bipolar  .  Cancer Brother        skin  . Hypertension Brother   . Cancer Paternal Aunt        brain   . Cancer Paternal Uncle        bone    Allergies: Allergies  Allergen Reactions  . Penicillins Hives and Other (See Comments)    Has patient had a PCN reaction causing immediate rash, facial/tongue/throat swelling, SOB or lightheadedness with hypotension: Unknown Has patient had a PCN reaction causing severe rash involving mucus membranes or skin necrosis: Unknown Has patient had a PCN reaction that required hospitalization: Unknown Has patient had a PCN reaction occurring within the last 10 years: No If all of the above answers are "NO", then may proceed with Cephalosporin use.     Medications Prior to Admission  Medication Sig Dispense Refill Last Dose  . acetaminophen (TYLENOL) 325 MG tablet Take 650 mg by mouth every 6 (six) hours as needed for moderate pain or headache.    Past Week at Unknown time  . aspirin EC 81 MG tablet Take 2 tablets (162 mg total) by mouth daily. 60 tablet 6 02/03/2018 at Unknown time  . prenatal vitamin w/FE, FA (PRENATAL 1 + 1) 27-1 MG TABS tablet Take 1 tablet by mouth daily at 12 noon. (Patient taking  differently: Take 1 tablet by mouth daily. ) 30 each 12 02/03/2018 at Unknown time  . butalbital-acetaminophen-caffeine (FIORICET, ESGIC) 50-325-40 MG tablet Take 1 tablet by mouth every 6 (six) hours as needed for headache. 20 tablet 0 Taking     Review of Systems  All systems reviewed and negative except as stated in HPI  Physical Exam Blood pressure 109/77, pulse 66, temperature 97.6 F (36.4 C), temperature source Oral, resp. rate 18, height 5\' 9"  (1.753 m), weight 97.1 kg, last menstrual period 05/03/2017. General appearance: alert, oriented, NAD Lungs: normal respiratory effort Heart: regular rate Abdomen: soft, non-tender; gravid, FH appropriate for GA Extremities: No calf swelling or tenderness Presentation: cephalic Fetal monitoring: Bseline 120s, +acel, no decels Uterine activity: irregular contractions    Prenatal labs: ABO, Rh: --/--/A POS, A POS Performed at [facility], [address] [place], [place] [address]  [phone]) Antibody: NEG [phone]) Rubella: 1.92 (04/11 1317) RPR: Non Reactive (08/02 0855)  HBsAg: Negative (04/11 1317)  HIV: Non Reactive (08/02 0855)  GC/Chlamydia: negative/negative GBS:   negative 2-hr GTT: normal Genetic screening:  negative Anatomy US: normal anatomy  Prenatal Transfer Tool  Maternal Diabetes: No Genetic Screening: Normal Maternal Ultrasounds/Referrals: Normal Fetal Ultrasounds or other Referrals:  None Maternal Substance Abuse:  No Significant Maternal Medications:  None Significant Maternal Lab Results: None  Results for orders placed or performed during the hospital encounter of 02/04/18 (from the past 24 hour(s))  CBC   Collection Time: 02/04/18  5:21 AM  Result Value Ref Range   WBC 10.0 4.0 - 10.5 K/uL   RBC 4.14 3.87 - 5.11 MIL/uL   Hemoglobin 12.7 12.0 - 15.0 g/dL   HCT 36.6 44.0 - 34.7 %   MCV 91.3 80.0 - 100.0 fL   MCH 30.7 26.0 - 34.0 pg   MCHC 33.6 30.0 - 36.0 g/dL   RDW 42.5 95.6 - 38.7 %    Platelets 176 150 - 400 K/uL   nRBC 0.0 0.0 - 0.2 %  Type and screen   Collection Time: 02/04/18  5:21 AM  Result Value Ref Range   ABO/RH(D) A POS    Antibody Screen NEG  Sample Expiration      02/07/2018 Performed at [facility], [address], [place], [place] [address]   ABO/Rh   Collection Time: 02/04/18  5:21 AM  Result Value Ref Range   ABO/RH(D)      A POS Performed at [facility], [address], [place], [place] [address]     Patient Active Problem List   Diagnosis Date Noted  . Low lying placenta, antepartum 09/06/2017  . Supervision of high risk pregnancy, antepartum 08/02/2017  . 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 AMA (advanced maternal age) multigravida 35+ 07/04/2017    Assessment: Mariah [name] is a 42 y.o. G4P0030 at [redacted]w[redacted]d here for pLTCS for LGA  #Labor: NA #Pain: NA #FWB: Cat I strip #ID:  GBS negative #MOF: breast #MOC:BTL #Circ:  NA  Gwenevere Abbot, MD 02/04/2018, 7:10 AM

## 2018-02-04 NOTE — Transfer of Care (Signed)
Immediate Anesthesia Transfer of Care Note  Patient: Mariah [name]  Procedure(s) Performed: CESAREAN SECTION WITH BILATERAL TUBAL LIGATION (N/A )  Patient Location: PACU  Anesthesia Type:Spinal  Level of Consciousness: awake, alert  and oriented  Airway & Oxygen Therapy: Patient Spontanous Breathing  Post-op Assessment: Report given to RN and Post -op Vital signs reviewed and stable  Post vital signs: Reviewed and stable  Last Vitals:  Vitals Value Taken Time  BP    Temp    Pulse 86 02/04/2018  8:47 AM  Resp    SpO2 98 % 02/04/2018  8:47 AM  Vitals shown include unvalidated device data.  Last Pain:  Vitals:   02/04/18 0541  TempSrc:   PainSc: 0-No pain         Complications: No apparent anesthesia complications

## 2018-02-05 ENCOUNTER — Telehealth: Payer: Self-pay | Admitting: *Deleted

## 2018-02-05 ENCOUNTER — Other Ambulatory Visit: Payer: Medicaid Other | Admitting: Obstetrics & Gynecology

## 2018-02-05 DIAGNOSIS — Z3A39 39 weeks gestation of pregnancy: Secondary | ICD-10-CM

## 2018-02-05 DIAGNOSIS — O34211 Maternal care for low transverse scar from previous cesarean delivery: Secondary | ICD-10-CM

## 2018-02-05 LAB — CBC
HCT: 30.3 % — ABNORMAL LOW (ref 36.0–46.0)
Hemoglobin: 10.4 g/dL — ABNORMAL LOW (ref 12.0–15.0)
MCH: 31.4 pg (ref 26.0–34.0)
MCHC: 34.3 g/dL (ref 30.0–36.0)
MCV: 91.5 fL (ref 80.0–100.0)
NRBC: 0 % (ref 0.0–0.2)
Platelets: 155 10*3/uL (ref 150–400)
RBC: 3.31 MIL/uL — ABNORMAL LOW (ref 3.87–5.11)
RDW: 14.6 % (ref 11.5–15.5)
WBC: 11.2 10*3/uL — AB (ref 4.0–10.5)

## 2018-02-05 LAB — BIRTH TISSUE RECOVERY COLLECTION (PLACENTA DONATION)

## 2018-02-05 LAB — RPR: RPR: NONREACTIVE

## 2018-02-05 NOTE — Telephone Encounter (Signed)
Left voice mail for pt to call us back to schedule pp appt.  02-05-18  AS

## 2018-02-05 NOTE — Progress Notes (Signed)
MOB was referred for history of depression/anxiety. * Referral screened out by Clinical Social Worker because none of the following criteria appear to apply: ~ History of anxiety/depression during this pregnancy, or of post-partum depression following prior delivery; No concerns noted in OB records.  ~ Diagnosis of anxiety and/or depression within last 3 years OR * MOB's symptoms currently being treated with medication and/or therapy.  Please contact the Clinical Social Worker if needs arise, by MOB request, or if MOB scores greater than 9/yes to question 10 on Edinburgh Postpartum Depression Screen.  Mariah [name], MSW, LCSW Clinical Social Work [phone]  

## 2018-02-05 NOTE — Progress Notes (Signed)
POSTPARTUM PROGRESS NOTE  POD #1  Subjective:  Mariah [name] is a 42 y.o. [id number] s/p pLTCS at [redacted]w[redacted]d.  No acute events overnight. She reports she is doing well. She denies any problems with ambulating, voiding or po intake. Denies nausea or vomiting. She has not passed flatus. Pain is well controlled.  Lochia is mild.  Objective: Blood pressure 110/69, pulse 83, temperature 98 F (36.7 C), temperature source Oral, resp. rate 18, height 5\' 9"  (1.753 m), weight 97.1 kg, last menstrual period 05/03/2017, SpO2 97 %, unknown if currently breastfeeding.  Physical Exam:  General: alert, cooperative and no distress Chest: no respiratory distress Heart:regular rate  Abdomen: soft, nontender, bowel sounds present  Uterine Fundus: firm, appropriately tender, below umbilicus  DVT Evaluation: No calf swelling or tenderness Extremities: no edema Skin: warm, dry; incision clean/dry/intact w/ honeycomb dressing in place  Recent Labs    02/04/18 0521 02/05/18 0534  HGB 12.7 10.4*  HCT 37.8 30.3*    Assessment/Plan: Mariah [name] is a 42 y.o. [id number] s/p pLTCS at [redacted]w[redacted]d   POD#1 - Doing welll; pain well controlled. H/H appropriate (12.7>10.4)  Routine postpartum care  OOB, ambulated Contraception: s/p BTL Feeding: breast  Dispo: Plan for discharge POD #3.   LOS: 1 day   Oralia Manis, DO PGY-2 02/05/2018, 8:39 AM

## 2018-02-05 NOTE — Lactation Note (Signed)
This note was copied from a baby's chart. Lactation Consultation Note  Patient Name: Mariah [name] [id number] Date: 02/05/2018 Reason for consult: Initial assessment;Term;1st time breastfeeding(LC encouraged mom to call - baby last fed at 1250 for 20 mins )  Baby is 3 hours old  LC reviewed and updated the doc flow sheets per mom and dad.  Breastfeeding range - 10 -45 mins , few 5 min snacks / Latch scores - 7-8 's  LC reviewed the importance of calling for [facility] assessment per shift,  Per mom active with the [facility] W[facility]./ also has DEBP - Medela at home.  Mother informed of post-discharge support and given phone number to the lactation department, including services for phone call assistance; out-patient appointments; and breastfeeding support group. List of other breastfeeding resources in the community given in the handout. Encouraged mother to call for problems or concerns related to breastfeeding.   Maternal Data Has patient been taught Hand Expression?: (needs to be shown ) Does the patient have breastfeeding experience prior to this delivery?: No  Feeding Feeding Type: (last fed at 1250 )  LATCH Score ( Latch Score by the [facility] )  Latch: Repeated attempts needed to sustain latch, nipple held in mouth throughout feeding, stimulation needed to elicit sucking reflex.  Audible Swallowing: A few with stimulation  Type of Nipple: Everted at rest and after stimulation  Comfort (Breast/Nipple): Soft / non-tender  Hold (Positioning): No assistance needed to correctly position infant at breast.  LATCH Score: 8  Interventions Interventions: Breast feeding basics reviewed  Lactation Tools Discussed/Used WIC Program: Yes(per mom )   Consult Status Consult Status: Follow-up Date: 02/05/18 Follow-up type: In-patient    Mariah [name] 02/05/2018, 1:17 PM

## 2018-02-06 MED ORDER — SENNOSIDES-DOCUSATE SODIUM 8.6-50 MG PO TABS: 2.0000 | ORAL_TABLET | ORAL | 0 refills | Status: DC

## 2018-02-06 MED ORDER — IBUPROFEN 600 MG PO TABS: 600.0000 mg | ORAL_TABLET | Freq: Four times a day (QID) | ORAL | 1 refills | Status: DC

## 2018-02-06 MED ORDER — OXYCODONE HCL 5 MG PO TABS: 5.0000 mg | ORAL_TABLET | ORAL | 0 refills | Status: DC | PRN

## 2018-02-06 NOTE — Discharge Instructions (Signed)

## 2018-02-06 NOTE — Lactation Note (Signed)
This note was copied from a baby's chart. Lactation Consultation Note  Patient Name: Girl Eavan Gonterman [id number] Date: 02/06/2018 Reason for consult: Follow-up assessment;Term;Infant weight loss P1, 45 hr female infant, weight loss 3% and mom with c/s delivery. Per mom, she is feeling more confident with BF abilities.  Infant cuing to feed as LC entered room. Per parents infant had 5 soiled and 8 wet diapers. Comfort gels given for nipple soreness.  Infant had one soiled and one wet diaper while LC was in the room with parents. Mom latched infant on right breast using football hold, infant with deep latch and swallowing observed. Latch score -9.  Infant BF 10 mins. and still BF as LC left room.   Maternal Data Formula Feeding for Exclusion: No Has patient been taught Hand Expression?: Yes  Feeding    LATCH Score Latch: Grasps breast easily, tongue down, lips flanged, rhythmical sucking.  Audible Swallowing: Spontaneous and intermittent  Type of Nipple: Everted at rest and after stimulation  Comfort (Breast/Nipple): Soft / non-tender  Hold (Positioning): Assistance needed to correctly position infant at breast and maintain latch.  LATCH Score: 9  Interventions    Lactation Tools Discussed/Used     Consult Status Consult Status: Follow-up Date: 02/07/18 Follow-up type: In-patient    Danelle Earthly 02/06/2018, 4:54 AM

## 2018-02-06 NOTE — Lactation Note (Signed)
This note was copied from a baby's chart. Lactation Consultation Note  Patient Name: Mariah [name] Date: 02/06/2018 Reason for consult: Follow-up assessment;Infant weight loss;1st time breastfeeding;Term;Nipple pain/trauma( 8% weight loss )  Baby is 64 hours old  LC reviewed and update the doc flow sheets per mom.  Baby awake, fussy and rooting. LC offered to assist with latch.  Checked diaper 1st / dry/ LC assisted mom with the football position  Using her boppy wit the the back side neck to moms side.  Added a folded bath blanket like a 2X4 on to for extra height.  LC explained to mom that will help her soreness decease sooner.  Baby latched with depth , multiple swallows noted and LC had mom do the  Breast compressions/ more swallows noted. Baby fell asleep after 15 mins of feeding  And when released nipple was well rounded and mom felt the feeding went well.  Baby asleep after the feeding.  Mom has been using the comfort gels for soreness and per mom they have been helping.  LC assessed breast tissue with assisting mom with the latch, nipples clear, just pinky red.  The areolas semi compressible and that is why mom is starting to get sore.  Mom recommended comfort gels after feedings x 6 days/ and alternating with breast shells.  Lc instructed mom on the use of shells, and hand pump.  LC also recommended since the weight loss is 8%, ( number of voids and stools correlate with weight loss ), prior to latch 1st breast - breast massage, hand express, pre-pump with hand pump, and reverse pressure latch with support ( as shown).  Sore nipple and engorgement prevention and tx.  LC discussed nutritive vs non - nutritive feeding patterns/ and the importance of watching the baby for hanging out latched. LC stressed the importance of STS feedings until the baby can stay awake for a feeding.  Mother informed of post-discharge support and given phone number to the lactation  department, including services for phone call assistance; out-patient appointments; and breastfeeding support group. List of other breastfeeding resources in the community given in the handout. Encouraged mother to call for problems or concerns related to breastfeeding.    Maternal Data Has patient been taught Hand Expression?: Yes(LC reviewed / and noted SN's and semi compressibel areolas )  Feeding Feeding Type: Breast Fed  LATCH Score Latch: Grasps breast easily, tongue down, lips flanged, rhythmical sucking.  Audible Swallowing: Spontaneous and intermittent  Type of Nipple: Everted at rest and after stimulation  Comfort (Breast/Nipple): Filling, red/small blisters or bruises, mild/mod discomfort  Hold (Positioning): Assistance needed to correctly position infant at breast and maintain latch.  LATCH Score: 8  Interventions Interventions: Breast feeding basics reviewed;Assisted with latch;Skin to skin;Breast massage;Hand express;Reverse pressure;Breast compression;Adjust position;Support pillows;Position options;Shells;Comfort gels;Hand pump  Lactation Tools Discussed/Used Tools: Shells;Pump;Flanges;Comfort gels Flange Size: 24;27;Other (comment)(for when milk comes in ) Shell Type: Inverted Breast pump type: Manual Pump Review: Setup, frequency, and cleaning;Milk Storage Initiated by:: MAI  Date initiated:: 02/06/18   Consult Status Consult Status: Complete Date: 02/06/18    Mariah [name] Mariah [name] 02/06/2018, 12:01 PM

## 2018-02-06 NOTE — Discharge Summary (Signed)
OB Discharge Summary     Patient Name: Mariah [name] DOB: 26-Aug-1975 MRN: [id number]  Date of admission: 02/04/2018 Delivering MD: Duane Lope H   Date of discharge: 02/06/2018  Admitting diagnosis: primary c section with BTL Intrauterine pregnancy: [redacted]w[redacted]d     Secondary diagnosis:  Active Problems:   Status post primary low transverse cesarean section  Additional problems: Fetal macrosomia      Discharge diagnosis: Term Pregnancy Delivered                                                                                                Post partum procedures:postpartum tubal ligation  Augmentation: None   Complications: None  Hospital course:  Sceduled C/S   42 y.o. yo [id number] at [redacted]w[redacted]d was admitted to the hospital 02/04/2018 for scheduled cesarean section with the following indication:Macrosomia.  Membrane Rupture Time/Date: 7:47 AM ,02/04/2018   Patient delivered a Viable infant.02/04/2018  Details of operation can be found in separate operative note.  Pateint had an uncomplicated postpartum course.  She is ambulating, tolerating a regular diet, passing flatus, and urinating well. Patient is discharged home in stable condition on  02/06/18         Physical exam  Vitals:   02/05/18 0930 02/05/18 1413 02/05/18 2100 02/06/18 0514  BP: 116/65 121/90 121/73 128/74  Pulse: 90 (!) 126 83 94  Resp: 18  18 16   Temp: 97.9 F (36.6 C) 97.6 F (36.4 C) 98.1 F (36.7 C) 97.7 F (36.5 C)  TempSrc: Oral Oral Oral   SpO2: 99% 98%  97%  Weight:      Height:       General: alert and cooperative Lochia: appropriate Uterine Fundus: firm Incision: Dressing is clean, dry, and intact DVT Evaluation: No evidence of DVT seen on physical exam. Labs: Lab Results  Component Value Date   WBC 11.2 (H) 02/05/2018   HGB 10.4 (L) 02/05/2018   HCT 30.3 (L) 02/05/2018   MCV 91.5 02/05/2018   PLT 155 02/05/2018   CMP Latest Ref Rng & Units 02/04/2018  Glucose 70 - 99 mg/dL -  BUN 6 -  20 mg/dL -  Creatinine 1.47 - 8.29 mg/dL 5.62(Z)  Sodium 308 - 657 mmol/L -  Potassium 3.5 - 5.1 mmol/L -  Chloride 98 - 111 mmol/L -  CO2 22 - 32 mmol/L -  Calcium 8.9 - 10.3 mg/dL -  Total Protein 6.5 - 8.1 g/dL -  Total Bilirubin 0.3 - 1.2 mg/dL -  Alkaline Phos 38 - 846 U/L -  AST 15 - 41 U/L -  ALT 0 - 44 U/L -    Discharge instruction: per After Visit Summary and "Baby and Me Booklet".  After visit meds:  Allergies as of 02/06/2018      Reactions   Penicillins Hives, Other (See Comments)   Has patient had a PCN reaction causing immediate rash, facial/tongue/throat swelling, SOB or lightheadedness with hypotension: Unknown Has patient had a PCN reaction causing severe rash involving mucus membranes or skin necrosis: Unknown Has patient had a PCN reaction that required hospitalization: Unknown Has  patient had a PCN reaction occurring within the last 10 years: No If all of the above answers are "NO", then may proceed with Cephalosporin use.      Medication List    TAKE these medications   acetaminophen 325 MG tablet Commonly known as:  TYLENOL Take 650 mg by mouth every 6 (six) hours as needed for moderate pain or headache.   aspirin EC 81 MG tablet Take 2 tablets (162 mg total) by mouth daily.   butalbital-acetaminophen-caffeine 50-325-40 MG tablet Commonly known as:  FIORICET, ESGIC Take 1 tablet by mouth every 6 (six) hours as needed for headache.   ibuprofen 600 MG tablet Commonly known as:  ADVIL,MOTRIN Take 1 tablet (600 mg total) by mouth every 6 (six) hours.   oxyCODONE 5 MG immediate release tablet Commonly known as:  Oxy IR/ROXICODONE Take 1 tablet (5 mg total) by mouth every 4 (four) hours as needed (pain scale 4-7).   prenatal vitamin w/FE, FA 27-1 MG Tabs tablet Take 1 tablet by mouth daily at 12 noon. What changed:  when to take this   senna-docusate 8.6-50 MG tablet Commonly known as:  Senokot-S Take 2 tablets by mouth daily. Start taking on:   02/07/2018       Diet: routine diet  Activity: Advance as tolerated. Pelvic rest for 6 weeks.   Outpatient follow up:4 weeks Follow up Appt: Future Appointments  Date Time Provider Department Center  02/08/2018 12:45 PM [facility] NURSE TECH [facility]  02/13/2018  3:15 PM Tilda Burrow, MD [facility]  03/13/2018  1:30 PM Cresenzo-Dishmon, Scarlette Calico, CNM [facility]   Follow up Visit:No follow-ups on file.  Postpartum contraception: Tubal Ligation  Newborn Data: Live born female  Birth Weight: 9 lb 8.6 oz (4325 g) APGAR: 8, 9  Newborn Delivery   Birth date/time:  02/04/2018 07:52:00 Delivery type:  C-Section, Low Transverse Trial of labor:  No C-section categorization:  Primary     Baby Feeding: Breast Disposition:home with mother   02/06/2018 De Hollingshead, DO

## 2018-02-08 ENCOUNTER — Encounter: Payer: Self-pay | Admitting: *Deleted

## 2018-02-08 ENCOUNTER — Ambulatory Visit (INDEPENDENT_AMBULATORY_CARE_PROVIDER_SITE_OTHER): Payer: Medicaid Other | Admitting: *Deleted

## 2018-02-08 VITALS — BP 126/70 | HR 85 | Wt 214.0 lb

## 2018-02-08 DIAGNOSIS — Z0130 Encounter for examination of blood pressure without abnormal findings: Secondary | ICD-10-CM

## 2018-02-08 NOTE — Progress Notes (Signed)
Patient here for BP check.  No problems or issues. Will return for pp visit.

## 2018-02-13 ENCOUNTER — Encounter: Payer: Self-pay | Admitting: Obstetrics and Gynecology

## 2018-02-13 ENCOUNTER — Ambulatory Visit (INDEPENDENT_AMBULATORY_CARE_PROVIDER_SITE_OTHER): Payer: Medicaid Other | Admitting: Obstetrics and Gynecology

## 2018-02-13 VITALS — BP 124/72 | HR 94 | Ht 69.0 in | Wt 190.0 lb

## 2018-02-13 DIAGNOSIS — Z9889 Other specified postprocedural states: Secondary | ICD-10-CM

## 2018-02-13 DIAGNOSIS — Z09 Encounter for follow-up examination after completed treatment for conditions other than malignant neoplasm: Secondary | ICD-10-CM

## 2018-02-13 NOTE — Progress Notes (Signed)
Patient ID: Mariah [name], female   DOB: 1975-10-01, 42 y.o.   MRN: [id number]  Subjective:  Mariah [name] is a 42 y.o. female now 1 week and 2 days status post C-section.  Got her tubes tied. Has had pain on right side right at the skin on incision site. Has pain on left side when she wakes up and takes her 30 minutes to get out of bed. Had a bit of diarrhea this morning but wanted to know what she could take that wouldn't affect her breast milk. Still take Ibprofeun 600 mg a day Review of Systems Negative except right side pain at incision site, minimal asymmetry   Diet:   normal   Bowel movements : normal.  Pain is controlled with current analgesics. Medications being used: ibuprofen (OTC).  Objective:  BP 124/72 (BP Location: Left Arm, Patient Position: Sitting, Cuff Size: Normal)   Pulse 94   Ht 5\' 9"  (1.753 m)   Wt 190 lb (86.2 kg)   Breastfeeding? Yes   BMI 28.06 kg/m  General:Well developed, well nourished.  No acute distress. Abdomen: Bowel sounds normal, soft, non-tender. Pelvic Exam: NOT DONE  Incision(s):  Healing well, no drainage, no erythema, no hernia, no swelling, no dehiscence, minimal asymmetry    Assessment:  Post-Op 1 week and 2 days s/p C-section   Doing well postoperatively.   Plan:  1. Imodium for diarrhea 2. return to work: not applicable. 3. Follow up in 4 weeks for PP visit  By signing my name below, I, Arnette Norris, attest that this documentation has been prepared under the direction and in the presence of Tilda Burrow, MD. Electronically Signed: Arnette Norris Medical Scribe. 02/13/18. 3:30 PM.  I personally performed the services described in this documentation, which was SCRIBED in my presence. The recorded information has been reviewed and considered accurate. It has been edited as necessary during review. Tilda Burrow, MD

## 2018-03-13 ENCOUNTER — Ambulatory Visit (INDEPENDENT_AMBULATORY_CARE_PROVIDER_SITE_OTHER): Payer: Medicaid Other | Admitting: Advanced Practice Midwife

## 2018-03-13 ENCOUNTER — Encounter: Payer: Self-pay | Admitting: Advanced Practice Midwife

## 2018-03-13 NOTE — Progress Notes (Addendum)
Mariah [name] is a 42 y.o. who presents for a postpartum visit. She is 5 weeks postpartum following a low cervical transverse Cesarean section. I have fully reviewed the prenatal and intrapartum course. The delivery was at 39.4 gestational weeks.(elective d/t macrosomia)  Anesthesia: spinal. Postpartum course has been unevnetful. Baby's course has been uneventful. Baby is feeding by bottle--just switched from breast. Bleeding: no bleeding. Bowel function is normal. Bladder function is normal. Patient is not sexually active. Contraception method is tubal ligation. Postpartum depression screening: negative.   Current Outpatient Medications:  .  acetaminophen (TYLENOL) 325 MG tablet, Take 650 mg by mouth every 6 (six) hours as needed for moderate pain or headache. , Disp: , Rfl:  .  prenatal vitamin w/FE, FA (PRENATAL 1 + 1) 27-1 MG TABS tablet, Take 1 tablet by mouth daily at 12 noon. (Patient taking differently: Take 1 tablet by mouth daily. ), Disp: 30 each, Rfl: 12  Review of Systems   Constitutional: Negative for fever and chills Eyes: Negative for visual disturbances Respiratory: Negative for shortness of breath, dyspnea Cardiovascular: Negative for chest pain or palpitations  Gastrointestinal: Negative for vomiting, diarrhea and constipation Genitourinary: Negative for dysuria and urgency Musculoskeletal: Negative for back pain, joint pain, myalgias  Neurological: Negative for dizziness and headaches    Objective:     Vitals:   03/13/18 1342  BP: (!) 124/91  Pulse: 92   General:  alert, cooperative and no distress   Breasts:  negative  Lungs: Normal respiratory effort  Heart:  regular rate and rhythm  Abdomen: Soft, nontender.  Incision healed well . Tiny stitch remnant left,                           Assessment:    normal postpartum exam.  Plan:   1. Contraception: Had BTL w/CS 2. Follow up in:   or as needed. Pap due in 2020 per pt

## 2018-05-07 ENCOUNTER — Other Ambulatory Visit: Payer: Medicaid Other | Admitting: Obstetrics & Gynecology

## 2019-05-07 ENCOUNTER — Other Ambulatory Visit: Payer: Self-pay

## 2019-05-07 ENCOUNTER — Emergency Department (HOSPITAL_COMMUNITY)
Admission: EM | Admit: 2019-05-07 | Discharge: 2019-05-07 | Disposition: A | Discharge status: Home / Self Care | Payer: Medicaid Other | Attending: Emergency Medicine | Admitting: Emergency Medicine

## 2019-05-07 ENCOUNTER — Encounter (HOSPITAL_COMMUNITY): Payer: Self-pay | Admitting: Emergency Medicine

## 2019-05-07 DIAGNOSIS — N3001 Acute cystitis with hematuria: Secondary | ICD-10-CM | POA: Insufficient documentation

## 2019-05-07 DIAGNOSIS — Z79899 Other long term (current) drug therapy: Secondary | ICD-10-CM | POA: Insufficient documentation

## 2019-05-07 DIAGNOSIS — R112 Nausea with vomiting, unspecified: Secondary | ICD-10-CM | POA: Insufficient documentation

## 2019-05-07 DIAGNOSIS — Z87891 Personal history of nicotine dependence: Secondary | ICD-10-CM | POA: Insufficient documentation

## 2019-05-07 LAB — CBC
HCT: 40.9 % (ref 36.0–46.0)
Hemoglobin: 13.1 g/dL (ref 12.0–15.0)
MCH: 29.2 pg (ref 26.0–34.0)
MCHC: 32 g/dL (ref 30.0–36.0)
MCV: 91.1 fL (ref 80.0–100.0)
Platelets: 160 10*3/uL (ref 150–400)
RBC: 4.49 MIL/uL (ref 3.87–5.11)
RDW: 13 % (ref 11.5–15.5)
WBC: 14.8 10*3/uL — ABNORMAL HIGH (ref 4.0–10.5)
nRBC: 0 % (ref 0.0–0.2)

## 2019-05-07 LAB — URINALYSIS, ROUTINE W REFLEX MICROSCOPIC
Bilirubin Urine: NEGATIVE
Glucose, UA: NEGATIVE mg/dL
Ketones, ur: 20 mg/dL — AB
Nitrite: POSITIVE — AB
Protein, ur: 100 mg/dL — AB
Specific Gravity, Urine: 1.021 (ref 1.005–1.030)
WBC, UA: >50 WBC/hpf — ABNORMAL HIGH (ref 0–5)
pH: 5 (ref 5.0–8.0)

## 2019-05-07 LAB — COMPREHENSIVE METABOLIC PANEL
ALT: 17 U/L (ref 0–44)
AST: 18 U/L (ref 15–41)
Albumin: 4.1 g/dL (ref 3.5–5.0)
Alkaline Phosphatase: 73 U/L (ref 38–126)
Anion gap: 9 (ref 5–15)
BUN: 15 mg/dL (ref 6–20)
CO2: 25 mmol/L (ref 22–32)
Calcium: 9.4 mg/dL (ref 8.9–10.3)
Chloride: 100 mmol/L (ref 98–111)
Creatinine, Ser: 1.1 mg/dL — ABNORMAL HIGH (ref 0.44–1.00)
GFR calc Af Amer: >60 mL/min (ref >60)
GFR calc non Af Amer: >60 mL/min (ref >60)
Glucose, Bld: 143 mg/dL — ABNORMAL HIGH (ref 70–99)
Potassium: 4.7 mmol/L (ref 3.5–5.1)
Sodium: 134 mmol/L — ABNORMAL LOW (ref 135–145)
Total Bilirubin: 0.7 mg/dL (ref 0.3–1.2)
Total Protein: 8 g/dL (ref 6.5–8.1)

## 2019-05-07 LAB — LIPASE, BLOOD: Lipase: 18 U/L (ref 11–51)

## 2019-05-07 LAB — POC URINE PREG, ED: Preg Test, Ur: NEGATIVE

## 2019-05-07 MED ORDER — ONDANSETRON HCL 4 MG/2ML IJ SOLN
4.0000 mg | Freq: Once | INTRAMUSCULAR | Status: AC
  Administered 2019-05-07: 4 mg via INTRAVENOUS
  Filled 2019-05-07: qty 2

## 2019-05-07 MED ORDER — SODIUM CHLORIDE 0.9 % IV BOLUS
1000.0000 mL | Freq: Once | INTRAVENOUS | Status: AC
  Administered 2019-05-07: 1000 mL via INTRAVENOUS

## 2019-05-07 MED ORDER — ACETAMINOPHEN 325 MG PO TABS
650.0000 mg | ORAL_TABLET | Freq: Once | ORAL | Status: AC
  Administered 2019-05-07: 650 mg via ORAL
  Filled 2019-05-07: qty 2

## 2019-05-07 MED ORDER — SODIUM CHLORIDE 0.9% FLUSH
3.0000 mL | Freq: Once | INTRAVENOUS | Status: AC
  Administered 2019-05-07: 19:00:00 3 mL via INTRAVENOUS

## 2019-05-07 MED ORDER — MORPHINE SULFATE (PF) 4 MG/ML IV SOLN
4.0000 mg | Freq: Once | INTRAVENOUS | Status: AC
  Administered 2019-05-07: 4 mg via INTRAVENOUS
  Filled 2019-05-07: qty 1

## 2019-05-07 MED ORDER — CEFDINIR 300 MG PO CAPS: 300.0000 mg | ORAL_CAPSULE | Freq: Two times a day (BID) | ORAL | 0 refills | Status: AC

## 2019-05-07 MED ORDER — CEFDINIR 300 MG PO CAPS
300.0000 mg | ORAL_CAPSULE | ORAL | Status: AC
  Administered 2019-05-07: 300 mg via ORAL
  Filled 2019-05-07: qty 1

## 2019-05-07 MED ORDER — CEFDINIR 300 MG PO CAPS: 300.0000 mg | ORAL_CAPSULE | Freq: Two times a day (BID) | ORAL | 0 refills | Status: DC

## 2019-05-07 MED ORDER — ONDANSETRON 4 MG PO TBDP: 4.0000 mg | ORAL_TABLET | Freq: Three times a day (TID) | ORAL | 0 refills | Status: DC | PRN

## 2019-05-07 NOTE — Discharge Instructions (Addendum)
Please take your antibiotic, as prescribed.   Please take ibuprofen or Tylenol should you develop any fevers or chills.  I have also prescribed you Zofran ODT to take as needed for your nausea symptoms.    Please return to the ED should he develop any inability to eat or drink, uncontrolled nausea or vomiting, worsening fevers or chills, or any other new or worsening symptoms.

## 2019-05-07 NOTE — ED Provider Notes (Signed)
[facility] EMERGENCY DEPARTMENT Provider Note   CSN: [id number] Arrival date & time: 05/07/19  1237     History Chief Complaint  Patient presents with  . Emesis    Mariah [name] is a 44 y.o. female with PMH significant for vertigo, migraines, and chronic pain syndrome who presents to the ED with complaints of uncontrolled nausea and vomiting, inability to tolerate p.o., chills, and mild body aches that all began shortly after episode of vertigo that occurred 2 days ago.  Patient reports that she has a history of vertigo for which she takes meclizine.  However, she has been unable to take her medications due to her uncontrolled nausea and vomiting.  She states that she usually takes prescribed narcotic medication for her chronic left knee and low midline back discomfort, however she has been able to do so due to her symptoms.  She also endorses foul-smelling urine, but denies any increased urinary frequency, urgency, or dysuria.  She feels weak and is hungry.  She denies any chest pain or difficulty breathing, cough, obvious COVID-19 exposure, changes in bowel habits, abdominal pain, current dizziness, blurred vision, numbness, or other neurologic deficits.     HPI     Past Medical History:  Diagnosis Date  . Anxiety    takes no meds  . Chronic chest wall pain   . Headache    migraines  . Vertigo     Patient Active Problem List   Diagnosis Date Noted  . Status post primary low transverse cesarean section 02/04/2018  . 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 CESAREAN SECTION    . CESAREAN SECTION WITH BILATERAL TUBAL LIGATION N/A 02/04/2018   Procedure: CESAREAN SECTION WITH BILATERAL TUBAL LIGATION;  Surgeon: Lazaro Arms, MD;  Location: [facility];  Service: Obstetrics;  Laterality: N/A;  . KNEE ARTHROSCOPY    . WISDOM TOOTH EXTRACTION       OB History    Gravida  5   Para  1   Term  1    Preterm      AB  3   Living  1     SAB  3   TAB      Ectopic      Multiple  0   Live Births  1           Family History  Problem Relation Age of Onset  . Heart disease Paternal Grandfather   . Heart attack Paternal Grandfather   . Diabetes Paternal Grandmother   . Hypertension Paternal Grandmother   . Asthma Paternal Grandmother   . Cancer Paternal Grandmother        SKIN   . Atrial fibrillation Paternal Grandmother        PACEMAKER  . Hypertension Father   . COPD Father   . Heart attack Father   . Diabetes Mother   . Hypertension Mother   . Mental illness Mother        bipolar  . Cancer Brother        skin  . Hypertension Brother   . Cancer Paternal Aunt        brain   . Cancer Paternal Uncle        bone    Social History   Tobacco Use  . Smoking status: Former Smoker    Types: Cigarettes    Quit date: 05/27/2014    Years since quitting: 4.9  .  Smokeless tobacco: Never Used  Substance Use Topics  . Alcohol use: Not Currently  . Drug use: No    Home Medications Prior to Admission medications   Medication Sig Start Date End Date Taking? Authorizing Provider  acetaminophen (TYLENOL) 325 MG tablet Take 650 mg by mouth every 6 (six) hours as needed for moderate pain or headache.     [provider]  cefdinir (OMNICEF) 300 MG capsule Take 1 capsule (300 mg total) by mouth 2 (two) times daily for 7 days. 05/07/19 05/14/19  Lorelee New, PA-C  prenatal vitamin w/FE, FA (PRENATAL 1 + 1) 27-1 MG TABS tablet Take 1 tablet by mouth daily at 12 noon. Patient taking differently: Take 1 tablet by mouth daily.  07/04/17   Adline Potter, NP    Allergies    Penicillins  Review of Systems   Review of Systems  All other systems reviewed and are negative.   Physical Exam Updated Vital Signs BP 127/80 (BP Location: Right Arm)   Pulse (!) 102   Temp 99 F (37.2 C) (Oral)   Resp 18   Ht 5' 8.5" (1.74 m)   Wt 63.5 kg   LMP 04/23/2019    SpO2 99%   BMI 20.98 kg/m   Physical Exam Vitals and nursing note reviewed. Exam conducted with a chaperone present.  Constitutional:      Appearance: Normal appearance.     Comments: Uncomfortable.  HENT:     Head: Normocephalic and atraumatic.  Eyes:     General: No scleral icterus.    Conjunctiva/sclera: Conjunctivae normal.  Cardiovascular:     Rate and Rhythm: Normal rate and regular rhythm.     Pulses: Normal pulses.     Heart sounds: Normal heart sounds.  Pulmonary:     Effort: Pulmonary effort is normal.     Breath sounds: Normal breath sounds.  Abdominal:     General: Abdomen is flat. There is no distension.     Palpations: Abdomen is soft.     Tenderness: There is no abdominal tenderness. There is no right CVA tenderness, left CVA tenderness or guarding.  Musculoskeletal:     Comments: Significant midline sacral TTP.  No overlying skin changes.  Skin:    General: Skin is dry.  Neurological:     General: No focal deficit present.     Mental Status: She is alert and oriented to person, place, and time.     GCS: GCS eye subscore is 4. GCS verbal subscore is 5. GCS motor subscore is 6.     Cranial Nerves: No cranial nerve deficit.     Sensory: No sensory deficit.     Motor: No weakness.     Gait: Gait normal.  Psychiatric:        Mood and Affect: Mood normal.        Behavior: Behavior normal.        Thought Content: Thought content normal.     ED Results / Procedures / Treatments   Labs (all labs ordered are listed, but only abnormal results are displayed) Labs Reviewed  COMPREHENSIVE METABOLIC PANEL - Abnormal; Notable for the following components:      Result Value   Sodium 134 (*)    Glucose, Bld 143 (*)    Creatinine, Ser 1.10 (*)    All other components within normal limits  CBC - Abnormal; Notable for the following components:   WBC 14.8 (*)    All other components within normal  limits  URINALYSIS, ROUTINE W REFLEX MICROSCOPIC - Abnormal; Notable  for the following components:   Color, Urine AMBER (*)    APPearance CLOUDY (*)    Hgb urine dipstick SMALL (*)    Ketones, ur 20 (*)    Protein, ur 100 (*)    Nitrite POSITIVE (*)    Leukocytes,Ua MODERATE (*)    WBC, UA >50 (*)    Bacteria, UA MANY (*)    Non Squamous Epithelial 0-5 (*)    All other components within normal limits  LIPASE, BLOOD  POC URINE PREG, ED    EKG None  Radiology No results found.  Procedures Procedures (including critical care time)  Medications Ordered in ED Medications  sodium chloride flush (NS) 0.9 % injection 3 mL (3 mLs Intravenous Given 05/07/19 1837)  morphine 4 MG/ML injection 4 mg (4 mg Intravenous Given 05/07/19 1625)  ondansetron (ZOFRAN) injection 4 mg (4 mg Intravenous Given 05/07/19 1625)  sodium chloride 0.9 % bolus 1,000 mL (0 mLs Intravenous Stopped 05/07/19 1720)  morphine 4 MG/ML injection 4 mg (4 mg Intravenous Given 05/07/19 1837)    ED Course  I have reviewed the triage vital signs and the nursing notes.  Pertinent labs & imaging results that were available during my care of the patient were reviewed by me and considered in my medical decision making (see chart for details).    MDM Rules/Calculators/A&P                      Patient is hemodynamically stable and neurovascular intact.  She was tachycardic in triage to 135, but she states that she was in significant pain and discomfort at that time and she also endorses dehydration due to diminished p.o. intake.  She was only mildly tachycardic on her EKG which demonstrated sinus tachycardia to 101.  Her CBC demonstrated a mild leukocytosis to 14.8 and her creatinine was mildly elevated to 1.10, suspect prerenal azotemia.  Her lipase is low normal months.  UA demonstrates cloudy appearance, positive nitrites, > 50 leukocytes, many bacteria, and is obviously concerning for urinary tract infection.  Will treat with Omnicef x7 days.  Her penicillin allergy was from when she was a  baby, according to her mother.  No reactions ever since.  Low suspicion for pyelonephritis given lack of CVA tenderness.    Patient is currently endorsing a significant migraine with photosensitivity.  Will treat with morphine and Zofran IV.  Will also provide patient with 1 L normal saline bolus given her mild hyponatremia 134 in addition to her suspected and reported dehydration.  Plan is to p.o. challenge patient prior to discharge and send her home with Zofran ODT in an effort to allow for continued administration of her chronic pain medication.  On reevaluation, patient reports that her nausea and vomiting is now under control.  She feels as though she can eat and drink.  We will p.o. challenge her and give her 1 more round of pain medication as she reports that she is still experiencing significant sacral discomfort, which she reports is chronic in nature and neither new nor unchanged.  After second round of IV morphine, patient is feeling improved and she is able to eat and drink without difficulty.  She is safe for discharge at this time.    Strict return precautions discussed with the patient. All of the evaluation and work-up results were discussed with the patient and any family at bedside. They were provided opportunity  to ask any additional questions and have none at this time. They have expressed understanding of verbal discharge instructions as well as return precautions and are agreeable to the plan.    Final Clinical Impression(s) / ED Diagnoses Final diagnoses:  Non-intractable vomiting with nausea, unspecified vomiting type  Acute cystitis with hematuria    Rx / DC Orders ED Discharge Orders         Ordered    cefdinir (OMNICEF) 300 MG capsule  2 times daily     05/07/19 1915           Reita Chard 05/07/19 Lorelle Formosa, MD 05/08/19 [phone]

## 2019-05-07 NOTE — ED Triage Notes (Signed)
Patient reports an episode of vertigo early Monday morning, has had emesis, weakness, chills, and body aches since.

## 2019-11-14 IMAGING — US US MFM OB LIMITED
1 series · 15 of 28 positions shown · non-contrast
Comparison: none

[Series 1: us mfm ob limited · 29 acquisitions, 15 frames shown]
[im 1/29]
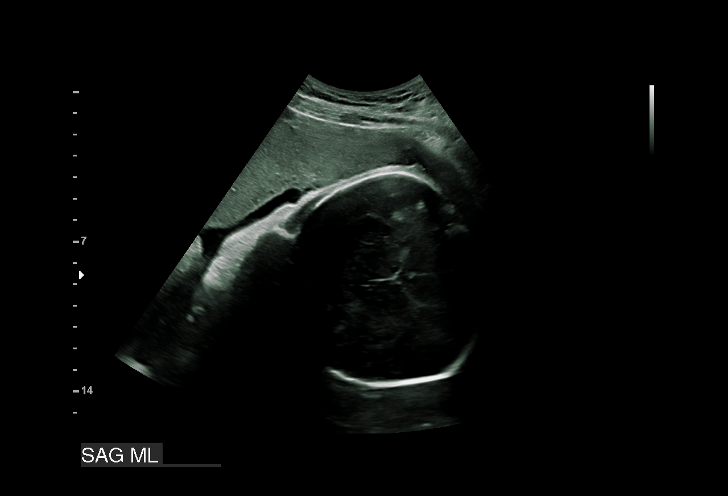
[im 3/29]
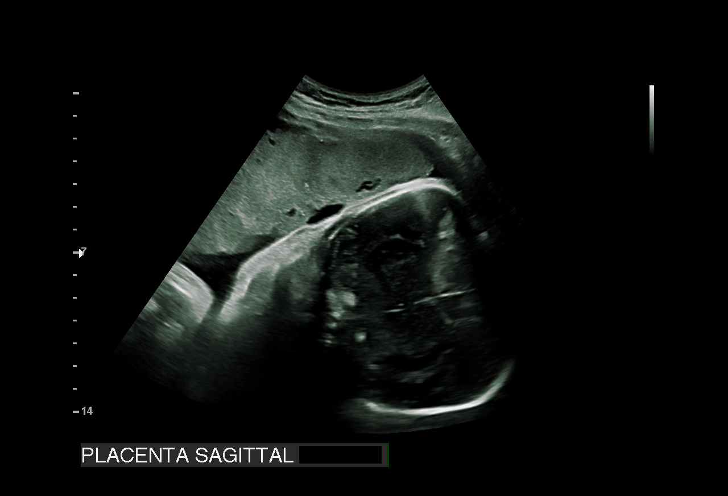
[im 5/29]
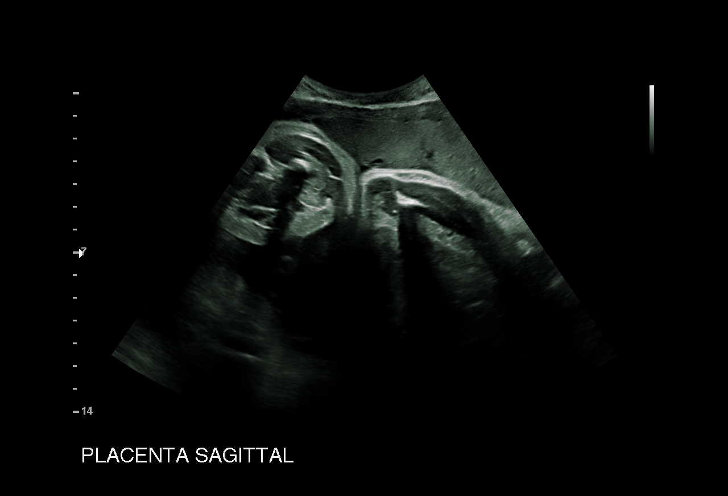
[im 7/29]
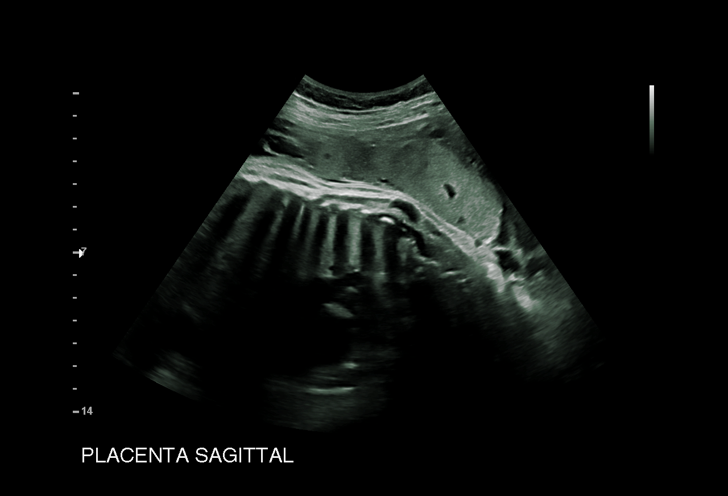
[im 9/29]
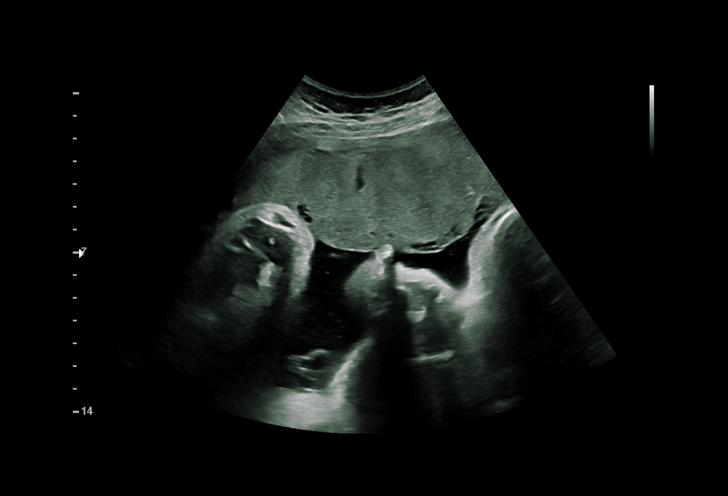
[im 11/29]
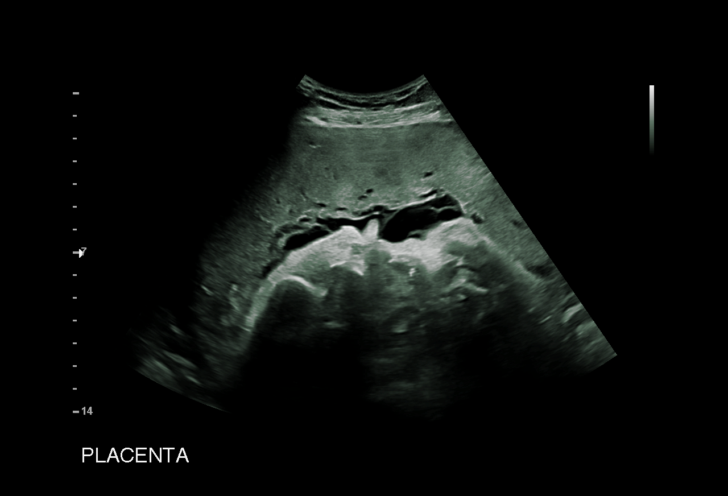
[im 13/29]
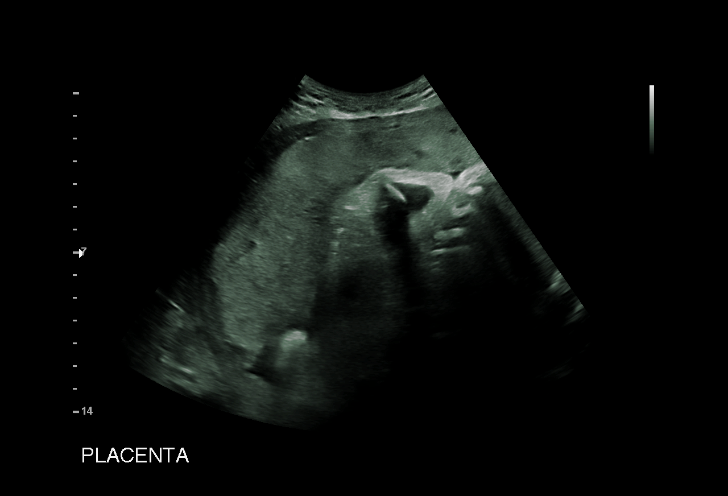
[im 15/29]
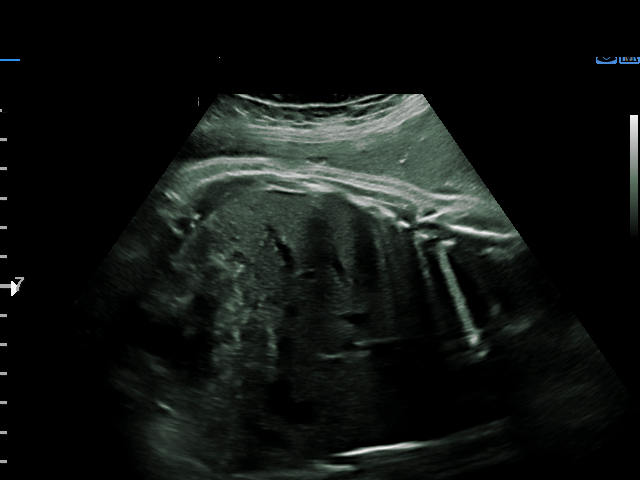
[im 16/29]
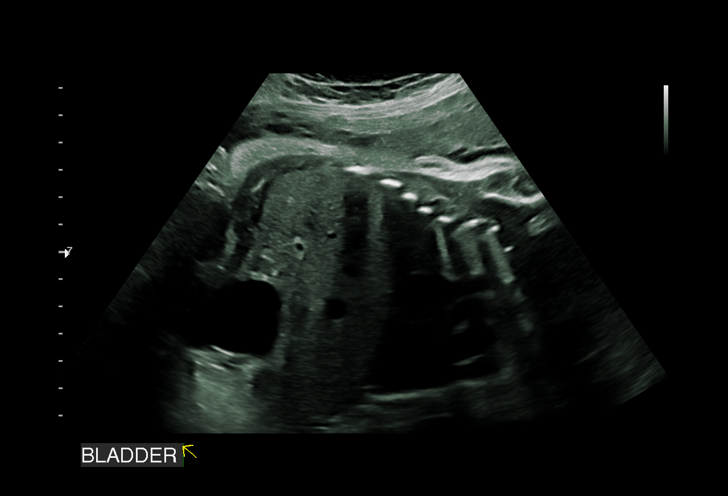
[im 18/29]
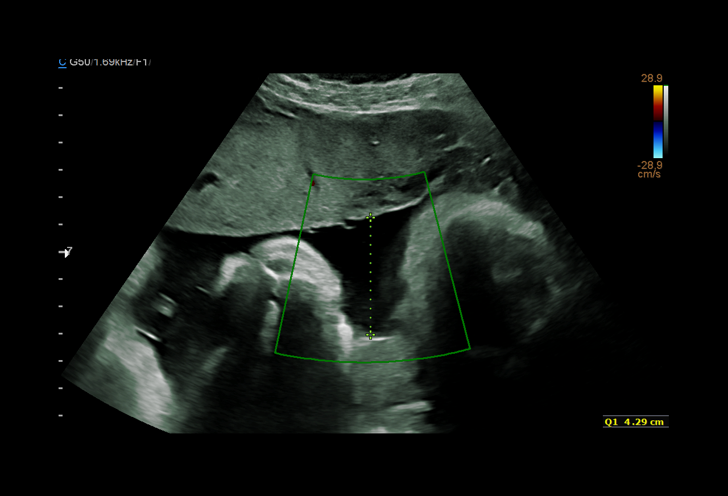
[im 20/29]
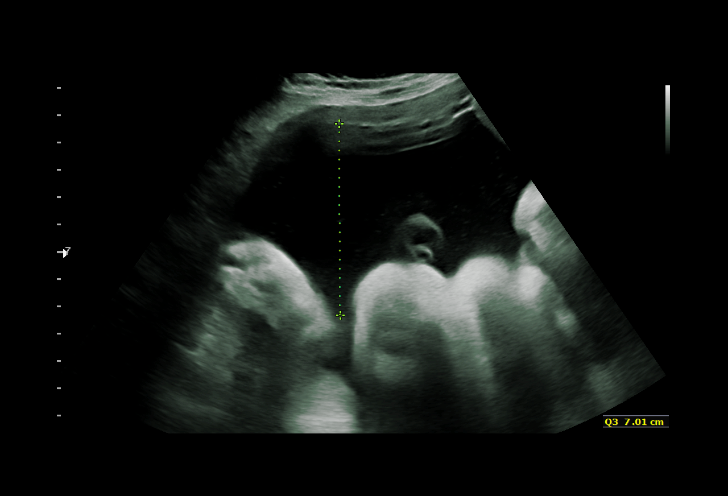
[im 22/29]
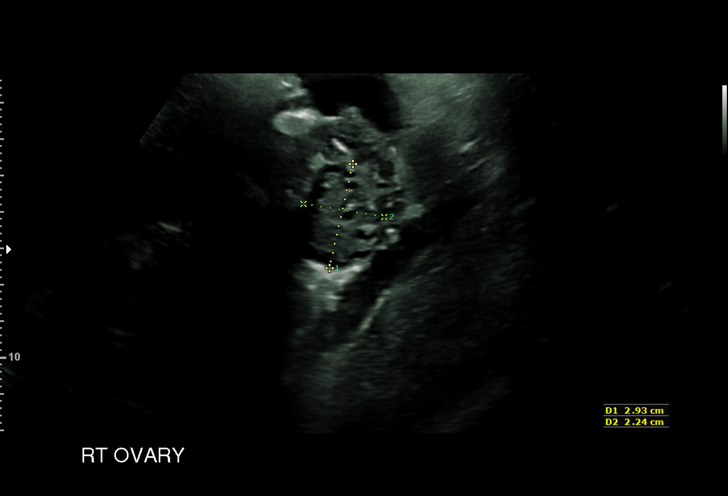
[im 24/29]
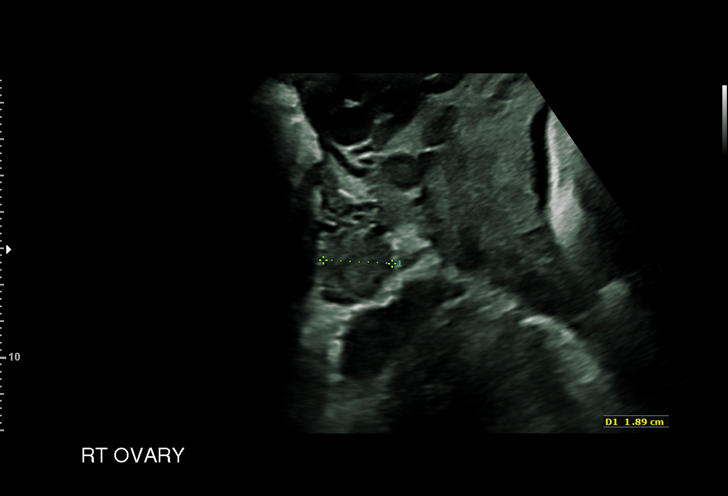
[im 26/29]
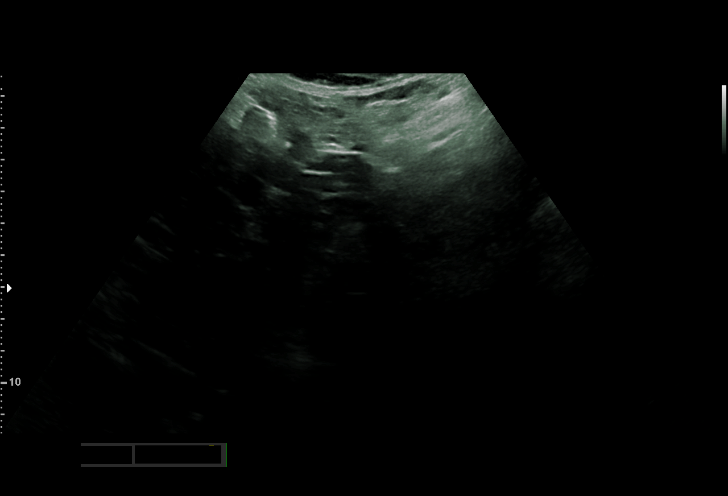
[im 29/29]
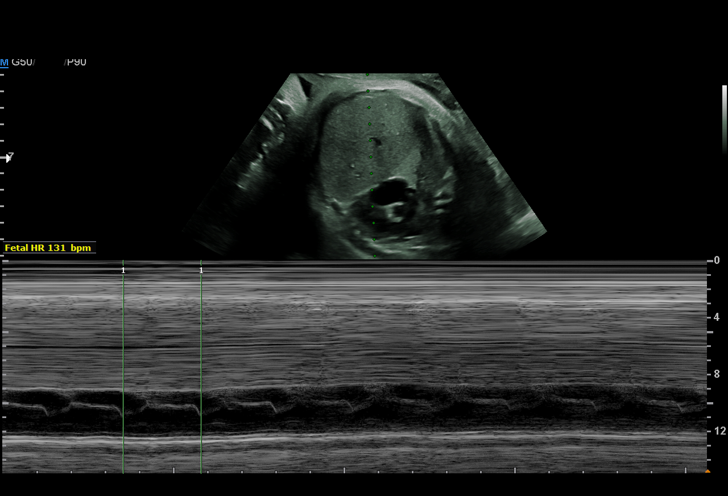

[15 of 28 positions shown; findings below may reference images not displayed]

Attending:        Gargi Tiger        Address:          [HOSPITAL]
OB/GYN
MAU/Triage

1  US MFM OB LIMITED                    [address].01     KUANG YU TIMAH

Indications

Vaginal bleeding in pregnancy, third trimester
Advanced maternal age primigravida 35+,
third trimester
36 weeks gestation of pregnancy
Fetal Evaluation

Num Of Fetuses:         1
Fetal Heart Rate(bpm):  131
Cardiac Activity:       Observed
Presentation:           Cephalic
Placenta:               Anterior

Amniotic Fluid
AFI FV:      Within normal limits

AFI Sum(cm)     %Tile       Largest Pocket(cm)
12.17           39

RUQ(cm)                     LUQ(cm)        LLQ(cm)
4.29
Biophysical Evaluation

Amniotic F.V:   Within normal limits       F. Tone:        Observed
F. Movement:    Observed                   Score:          [DATE]
F. Breathing:   Observed
OB History

Gravidity:    4          SAB:   3
Living:       0
Gestational Age

Clinical EDD:  36w 5d                                        EDD:   02/07/18
Best:          36w 5d     Det. By:  Clinical EDD             EDD:   02/07/18
Anatomy

Stomach:               Appears normal, left   Bladder:                Appears normal
sided
Cervix Uterus Adnexa

Cervix
Not visualized (advanced GA >96wks)

Left Ovary
Not visualized.

Right Ovary
Within normal limits.
Comments

U/S images reviewed.   No evidence of fetal compromise is
found on BPP today.  No fetal abnormalities are seen.  No
evidence of placenta previa or abruption is found on today's
U/S.  Please remember that the absence of an abruption on
U/S does not rule it out.
Recommendations: 1) Close A-P surveillance
Recommendations

Close A-P surveillance

## 2022-02-28 ENCOUNTER — Ambulatory Visit (INDEPENDENT_AMBULATORY_CARE_PROVIDER_SITE_OTHER): Payer: Medicaid Other | Admitting: Family Medicine

## 2022-02-28 VITALS — BP 116/76 | Ht 70.0 in | Wt 149.8 lb

## 2022-02-28 DIAGNOSIS — G8929 Other chronic pain: Secondary | ICD-10-CM | POA: Diagnosis not present

## 2022-02-28 DIAGNOSIS — M545 Low back pain, unspecified: Secondary | ICD-10-CM

## 2022-02-28 MED ORDER — MELOXICAM 15 MG PO TABS: 15.0000 mg | ORAL_TABLET | Freq: Every day | ORAL | 0 refills | Status: DC

## 2022-02-28 MED ORDER — BACLOFEN 10 MG PO TABS: 10.0000 mg | ORAL_TABLET | Freq: Three times a day (TID) | ORAL | 0 refills | Status: DC | PRN

## 2022-02-28 NOTE — Patient Instructions (Addendum)
Medications as prescribed.  We will arrange the referral.  Take care  Dr. Lacinda Axon

## 2022-03-01 DIAGNOSIS — G8929 Other chronic pain: Secondary | ICD-10-CM | POA: Insufficient documentation

## 2022-03-01 DIAGNOSIS — Z96652 Presence of left artificial knee joint: Secondary | ICD-10-CM | POA: Insufficient documentation

## 2022-03-01 DIAGNOSIS — M545 Low back pain, unspecified: Secondary | ICD-10-CM | POA: Insufficient documentation

## 2022-03-01 NOTE — Assessment & Plan Note (Signed)
Discussed treatment options today including anti-inflammatories, muscle relaxers, physical therapy, injections. I advised the patient that given the duration of her back pain I feel that she is unlikely to respond to conservative treatments.  I am starting her on meloxicam and baclofen.  Referring to pain management for consideration for injection therapy and potential other modalities.

## 2022-03-01 NOTE — Progress Notes (Signed)
Subjective:  Patient ID: Mariah [name], female    DOB: 1975/06/26  Age: 46 y.o. MRN: [id number]  CC: Establish care, low back pain  HPI Mariah [name] is a 46 y.o. female presents to the clinic today to establish care.  Patient would like to discuss chronic low back pain.  Patient reports that she has had low back pain for greater than 10 years.  Recently worsening.  Was seen in the ER on 10/16.  X-ray was obtained that time and revealed lower lumbar facet degenerative changes and mild dextroscoliosis.  She was treated with a Medrol Dosepak and Robaxin.  Patient reports that she continues to have low back pain.  Located in the midline.  No radicular symptoms.  She states that muscle relaxers do not help as they make her tired and fatigued.  She has been taking Tylenol and ibuprofen without significant resolution.  She also reports that she took the steroid pack that was prescribed without significant improvement.  She has not done physical therapy.  No other medications or interventions tried.  We will discuss treatment options today.  Patient denies any saddle anesthesia or incontinence.   PMH, Surgical Hx, Family Hx, Social History reviewed and updated as below.  Past medical history: History of anxiety, chronic low back pain  Past Surgical History:  Procedure Laterality Date   CESAREAN SECTION     CESAREAN SECTION WITH BILATERAL TUBAL LIGATION N/A 02/04/2018   Procedure: CESAREAN SECTION WITH BILATERAL TUBAL LIGATION;  Surgeon: Lazaro Arms, MD;  Location: [facility];  Service: Obstetrics;  Laterality: N/A;   KNEE ARTHROSCOPY     WISDOM TOOTH EXTRACTION     Family History  Problem Relation Age of Onset   Heart disease Paternal Grandfather    Heart attack Paternal Grandfather    Diabetes Paternal Grandmother    Hypertension Paternal Grandmother    Asthma Paternal Grandmother    Cancer Paternal Grandmother        SKIN    Atrial fibrillation Paternal Grandmother         PACEMAKER   Hypertension Father    COPD Father    Heart attack Father    Diabetes Mother    Hypertension Mother    Mental illness Mother        bipolar   Cancer Brother        skin   Hypertension Brother    Cancer Paternal Aunt        brain    Cancer Paternal Uncle        bone   Social History   Tobacco Use   Smoking status: Former    Types: Cigarettes    Quit date: 05/27/2014    Years since quitting: 7.7   Smokeless tobacco: Never  Substance Use Topics   Alcohol use: Not Currently    Review of Systems Per HPI  Objective:   Today's Vitals: BP 116/76   Ht 5\' 10"  (1.778 m)   Wt 149 lb 12.8 oz (67.9 kg)   BMI 21.49 kg/m   Physical Exam Vitals and nursing note reviewed.  Constitutional:      General: She is not in acute distress.    Appearance: Normal appearance.  HENT:     Head: Normocephalic and atraumatic.  Eyes:     General:        Right eye: No discharge.        Left eye: No discharge.     Conjunctiva/sclera: Conjunctivae normal.  Cardiovascular:     Rate and Rhythm: Normal rate and regular rhythm.  Pulmonary:     Effort: Pulmonary effort is normal.     Breath sounds: Normal breath sounds. No wheezing, rhonchi or rales.  Musculoskeletal:     Comments: Lumbar spine with no significant tenderness in the midline.  No spasm noted.  Neurological:     Mental Status: She is alert.  Psychiatric:        Mood and Affect: Mood normal.        Behavior: Behavior normal.      Assessment & Plan:   Problem List Items Addressed This Visit       Other   Chronic midline low back pain - Primary    Discussed treatment options today including anti-inflammatories, muscle relaxers, physical therapy, injections. I advised the patient that given the duration of her back pain I feel that she is unlikely to respond to conservative treatments.  I am starting her on meloxicam and baclofen.  Referring to pain management for consideration for injection therapy and  potential other modalities.      Relevant Medications   meloxicam (MOBIC) 15 MG tablet   baclofen (LIORESAL) 10 MG tablet   Other Relevant Orders   Ambulatory referral to Pain Clinic    Meds ordered this encounter  Medications   meloxicam (MOBIC) 15 MG tablet    Sig: Take 1 tablet (15 mg total) by mouth daily.    Dispense:  30 tablet    Refill:  0   baclofen (LIORESAL) 10 MG tablet    Sig: Take 1 tablet (10 mg total) by mouth 3 (three) times daily as needed for muscle spasms.    Dispense:  30 each    Refill:  0   Zavior Thomason DO [facility]

## 2022-03-07 ENCOUNTER — Encounter: Payer: Self-pay | Admitting: Physical Medicine and Rehabilitation

## 2022-03-10 ENCOUNTER — Encounter: Payer: Self-pay | Admitting: Family Medicine

## 2022-03-13 ENCOUNTER — Encounter: Payer: Self-pay | Admitting: Physical Medicine and Rehabilitation

## 2022-03-13 ENCOUNTER — Encounter: Payer: 59 | Attending: Physical Medicine and Rehabilitation | Admitting: Physical Medicine and Rehabilitation

## 2022-03-13 VITALS — BP 116/80 | HR 106 | Ht 69.0 in | Wt 147.0 lb

## 2022-03-13 DIAGNOSIS — Z79891 Long term (current) use of opiate analgesic: Secondary | ICD-10-CM | POA: Insufficient documentation

## 2022-03-13 DIAGNOSIS — Z5181 Encounter for therapeutic drug level monitoring: Secondary | ICD-10-CM | POA: Insufficient documentation

## 2022-03-13 DIAGNOSIS — G894 Chronic pain syndrome: Secondary | ICD-10-CM | POA: Insufficient documentation

## 2022-03-13 DIAGNOSIS — M545 Low back pain, unspecified: Secondary | ICD-10-CM | POA: Insufficient documentation

## 2022-03-13 DIAGNOSIS — G8929 Other chronic pain: Secondary | ICD-10-CM | POA: Insufficient documentation

## 2022-03-13 DIAGNOSIS — M533 Sacrococcygeal disorders, not elsewhere classified: Secondary | ICD-10-CM | POA: Diagnosis present

## 2022-03-13 NOTE — Assessment & Plan Note (Signed)
Today, we performed a urine drug screen and signed a pain management contract. If the urine drug screen results in 5-7 day without any unexpected substances, we will prescribe you Tramadol 50 mg tablets twice daily #60 tabs with two refills. You will follow back up in clinic prior to your next needed refill; bring your medications with you at that time for a pill count.  You will also continue your meloxicam and use as needed tylenol up to 2000 mg daily for adjunctive pain control. You can use voltaren gel on your low back up to four times daily as well. Please avoid other NSAIDs like Ibuprofen while on meloxicam.

## 2022-03-13 NOTE — Assessment & Plan Note (Signed)
I will be getting xrays of your back and referring you to Dr. Wynn Banker for a right sacroiliac steroid injection. If this goes well, we can repeat it on the other side and perform every 3-6 months.  Once you have this injection, if pain is improved at follow up, we will refer you to PT for strengthening of your gluts and stretching to reduce your pain further.

## 2022-03-13 NOTE — Progress Notes (Signed)
Subjective:    Patient ID: Mariah [name], female    DOB: Jul 19, 1975, 46 y.o.   MRN: [id number]  HPI  Mariah [name] is a 46 y.o. year old female  who  has a past medical history of Anxiety, Chronic chest wall pain, Headache, and Vertigo.   They are presenting to PM&R clinic as a new patient for pain management evaluation. They were referred by Tommie Sams, DO  for treatment of midline low back pain.  Was prior seeing Dr. Cecelia Byars in [place] but he retired 2 years ago.   Source: Low back, radiating into buttocks with prolonged sitting, bilateral.  Inciting incident: None; got worse after her pregnancy in 2019. Duration of pain: Constant, better and worse with certain activities.  Description of pain: Aching, with occasional sharp stabbing pains.  Severity: On average 8-9/10. At worst 10/10. At best 5/10. Exacerbating factors: Bending forward, long car rides, sitting or standing for prolonged periods. Laying on one side for too long at nighttime. First thing in the morning she needs to stretch to "work it out". When she works, she works in 12 hour shifts, so the end of these is the worst period for her.  Remitting factors: Sleeping with a pillow between her legs. Using heated seats or a heating pad. Laying on a hard, flat surface like a bench or the floor.   Red flag symptoms: Patient denies saddle anesthesia, loss of bowel or bladder continence, new weakness, new numbness/tingling, or pain waking up at nighttime.  Medications tried: Topical medications ( some effect on knees) : Bengay OTC, without benefit. Has used voltaren on her knees but not her back.   Nsaids ( no effect): Ibuprofen 800 mg tablets PRN, will take 4-6 ove rthe course of a work day (12 hours). Has avoided since starting Meloxicam 15 mg; has noted no benefit.   Tylenol  (mild effect): Intermittent use of tylenol 650 mg tabs, will help for 5 hours and bring town 1-2 point.   Opiates  (good effect): oxycodone 5 mg  tabs last filled 12/2020, #45 tabs. Had good relief with working shifts; stopped because her doctor retired. Had mild constipation only, which was manageable.   Gabapentin / Lyrica: Gabapentin gives her "crazy dreams".  TCAs : never tried SNRIs : never tried [name]  ( some effect): Muscle relaxants made her sleepy.   [name] treatments: PT/OT: Had PT for her knee 1 year prior; never had for her back.  Accupuncture/chiropractor/massage: Never tried TENs unit : Never tried Injections: Never in her back, did have one in her neck >10 year prior.  Surgery : Never on back; replaced left knee in May or last year. 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 No one has ever discussed work restrictions with her.   Pain Inventory Average Pain 8 Pain Right Now 5 My pain is sharp and aching  In the last 24 hours, has pain interfered with the following? General activity 6 Relation with others 6 Enjoyment of life 6 What TIME of day is your pain at its worst? evening and night Sleep (in general) Poor  Pain is worse with: bending, sitting, and standing Pain improves with: rest and heat/ice Relief from Meds: 2  walk without assistance how many minutes can you walk? 45 ability to  climb steps?  yes do you drive?  no  employed # of hrs/week 36 I need assistance with the following:  household duties  spasms dizziness anxiety  Any changes since last visit?  no  Primary care Mariah Other DO    Family History  Problem Relation Age of Onset   Heart disease Paternal Grandfather    Heart attack Paternal Grandfather    Diabetes Paternal Grandmother    Hypertension Paternal Grandmother    Asthma Paternal Grandmother    Cancer Paternal Grandmother        SKIN    Atrial fibrillation Paternal Grandmother         PACEMAKER   Hypertension Father    COPD Father    Heart attack Father    Diabetes Mother    Hypertension Mother    Mental illness Mother        bipolar   Cancer Brother        skin   Hypertension Brother    Cancer Paternal Aunt        brain    Cancer Paternal Uncle        bone   Social History   Socioeconomic History   Marital status: Married    Spouse name: Not on file   Number of children: Not on file   Years of education: Not on file   Highest education level: Not on file  Occupational History   Not on file  Tobacco Use   Smoking status: Former    Types: Cigarettes    Quit date: 05/27/2014    Years since quitting: 7.8   Smokeless tobacco: Never  Vaping Use   Vaping Use: Never used  Substance and Sexual Activity   Alcohol use: Not Currently   Drug use: No   Sexual activity: Not Currently    Birth control/protection: Surgical    Comment: tubal  [name] Topics Concern   Not on file  Social History Narrative   Not on file   Social Determinants of Health   Financial Resource Strain: Low Risk  (01/29/2018)   Overall Financial Resource Strain (CARDIA)    Difficulty of Paying Living Expenses: Not hard at all  Food Insecurity: No Food Insecurity (01/29/2018)   Hunger Vital Sign    Worried About Running Out of Food in the Last Year: Never true    Ran Out of Food in the Last Year: Never true  Transportation Needs: Unknown (01/29/2018)   PRAPARE - Administrator, Civil Service (Medical): No    Lack of Transportation (Non-Medical): Not on file  Physical Activity: Not on file  Stress: No Stress Concern Present (01/29/2018)   [employer] of Occupational Health - Occupational Stress Questionnaire    Feeling of Stress : Only a little  Social Connections: Not on file   Past Surgical History:  Procedure Laterality Date   CESAREAN SECTION     CESAREAN SECTION WITH BILATERAL TUBAL LIGATION N/A 02/04/2018   Procedure: CESAREAN SECTION WITH BILATERAL TUBAL  LIGATION;  Surgeon: Lazaro Arms, MD;  Location: [facility];  Service: Obstetrics;  Laterality: N/A;   KNEE ARTHROSCOPY     WISDOM TOOTH EXTRACTION     Past Medical History:  Diagnosis Date   Anxiety    takes no meds   Chronic chest wall pain    Headache    migraines   Vertigo    BP 116/80   Pulse (!) 106   Ht 5\' 9"  (1.753 m)  Wt 147 lb (66.7 kg)   SpO2 97%   BMI 21.71 kg/m   Opioid Risk Score:   Fall Risk Score:  `1  [facility]     03/13/2022   11:34 AM 02/28/2022    2:17 PM 08/02/2017   10:06 AM  Depression screen PHQ 2/9  Decreased Interest 1 0 0  Down, Depressed, Hopeless 0 0 0  PHQ - 2 Score 1 0 0  Altered sleeping 2  0  Tired, decreased energy 1  1  Change in appetite 1  0  Feeling bad or failure about yourself  0  0  Trouble concentrating 0  1  Moving slowly or fidgety/restless 1  0  Suicidal thoughts 0  0  PHQ-9 Score 6  2  Difficult doing work/chores Very difficult  Not difficult at all     Review of Systems  Constitutional:  Positive for appetite change and unexpected weight change.       Wt loss  HENT: Negative.    Eyes: Negative.   Respiratory: Negative.    Cardiovascular: Negative.   Gastrointestinal: Negative.   Endocrine: Negative.   Genitourinary: Negative.   Musculoskeletal:        Spasms  Skin: Negative.   Allergic/Immunologic: Negative.   Neurological:  Positive for dizziness.  Hematological: Negative.   Psychiatric/Behavioral:  Positive for dysphoric mood. The patient is nervous/anxious.   All [name] systems reviewed and are negative.      Objective:   Physical Exam  Constitution: Appropriate appearance for age. No apparnet distress  HEENT: PERRL, EOMI grossly intact.  Resp: CTAB. No rales, rhonchi, or wheezing. Cardio: RRR. No mumurs, rubs, or gallops. No peripheral edema. Abdomen: Nondistended. Nontender. +bowel sounds. Psych: Appropriate mood and affect. Neuro: AAOx4. No apparent deficits  MSK:  + atrophy bilateral gluteal muscles; otherwise normal exam. 5/5 strength BL Les.   Back Exam:   Inspection: Pelvis was even .  Lumbar lordotic curvature was WNL  There was no evidence of scoliosis.  Palpation: Palpatory exam revealed ttp at the R>L PSIS, SI joints. There was no evidence of spasm. No trigger points were noted.    ROM: WNL in back flexion, extension  Special/provocative testing:    SLR: Negative   Slump test: negative   Facet loading: + Mild bilateral  (very non-specific)   TTP at paraspinals: + Mild bilateral  (sensitive for facet pain...if no ttp then likely not facet pain)   Pearlean BrownieFaber test: + R>L   FAIR test: Negative bilaterally   Gaenslen test: + R>L   Yeoman's test: + R>L   Thomas Test: Negative bilaterally   Information in () parenthesis is normals/details of specific exam.       Assessment & Plan:   Mariah Apaamara V Haymaker is a 46 y.o. year old female  who  has a past medical history of Anxiety, Chronic chest wall pain, Headache, and Vertigo.   They are presenting to PM&R clinic as a new patient for pain management evaluation. They were referred by Tommie Samsook, Jayce G, DO  for treatment of midline low back pain. 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  Chronic pain syndrome Assessment & Plan: Indication for chronic opioid: Chronic low back pain Medication and dose: Tramadol 50 mg BID PRN # pills per month: 60 Last UDS date: 03/13/22 Opioid Treatment Agreement signed (Y/N): Y, 05/13/21 Opioid Treatment Agreement last reviewed with patient:   NCCSRS/PDMP reviewed this encounter (include red flags): Yes  Low Risk (<10 MME) UDS every 6-12 months NCCSR check every visit Follow up Q3M initially, Q6M once established   Orders: -     ToxAssure Select,+Antidepr,UR  Pain of right sacroiliac joint Assessment & Plan: I will be getting xrays of your back and referring you to Dr. Wynn Banker for a right sacroiliac steroid injection. If  this goes well, we can repeat it on the [name] side and perform every 3-6 months.  Once you have this injection, if pain is improved at follow up, we will refer you to PT for strengthening of your gluts and stretching to reduce your pain further.   Orders: -     DG Lumbar Spine 2-3 Views; Future  Chronic bilateral low back pain without sciatica Assessment & Plan: Today, we performed a urine drug screen and signed a pain management contract. If the urine drug screen results in 5-7 day without any unexpected substances, we will prescribe you Tramadol 50 mg tablets twice daily #60 tabs with two refills. You will follow back up in clinic prior to your next needed refill; bring your medications with you at that time for a pill count.  You will also continue your meloxicam and use as needed tylenol up to 2000 mg daily for adjunctive pain control. You can use voltaren gel on your low back up to four times daily as well. Please avoid [name] NSAIDs like Ibuprofen while on meloxicam.    Orders: -     DG Lumbar Spine 2-3 Views; Future  Encounter for long-term use of opiate analgesic -     ToxAssure Select,+Antidepr,UR  Encounter for therapeutic drug monitoring -     ToxAssure Select,+Antidepr,UR     Angelina Sheriff, DO 03/13/2022

## 2022-03-13 NOTE — Patient Instructions (Signed)
Today, we performed a urine drug screen and signed a pain management contract. If the urine drug screen results in 5-7 day without any unexpected substances, we will prescribe you Tramadol 50 mg tablets twice daily #60 tabs with two refills. You will follow back up in clinic prior to your next needed refill; bring your medications with you at that time for a pill count.  You will also continue your meloxicam and use as needed tylenol up to 2000 mg daily for adjunctive pain control. You can use voltaren gel on your low back up to four times daily as well. Please avoid other NSAIDs like Ibuprofen while on meloxicam.   I will be getting xrays of your back and referring you to Dr. Wynn Banker for a right sacroiliac steroid injection. If this goes well, we can repeat it on the other side and perform every 3-6 months.  Once you have this injection, if pain is improved at follow up, we will refer you to PT for strengthening of your gluts and stretching to reduce your pain further.

## 2022-03-13 NOTE — Assessment & Plan Note (Signed)
Indication for chronic opioid: Chronic low back pain Medication and dose: Tramadol 50 mg BID PRN # pills per month: 60 Last UDS date: 03/13/22 Opioid Treatment Agreement signed (Y/N): Y, 05/13/21 Opioid Treatment Agreement last reviewed with patient:   NCCSRS/PDMP reviewed this encounter (include red flags): Yes   Low Risk (<10 MME) UDS every 6-12 months NCCSR check every visit Follow up Q3M initially, Q6M once established

## 2022-03-16 ENCOUNTER — Encounter: Payer: Self-pay | Admitting: Physical Medicine and Rehabilitation

## 2022-03-16 LAB — TOXASSURE SELECT,+ANTIDEPR,UR

## 2022-03-16 MED ORDER — TRAMADOL HCL 50 MG PO TABS: 50.0000 mg | ORAL_TABLET | Freq: Two times a day (BID) | ORAL | 2 refills | Status: DC | PRN

## 2022-03-16 NOTE — Progress Notes (Signed)
UDS as expected, sent script for Tramadol 50 mg tablets take 1 tablet every 12 hours as needed #60 tablets sent to [facility] in [place], [place], with 2 refills. MyChart message sent to inform patient.

## 2022-03-16 NOTE — Addendum Note (Signed)
Addended by: Elijah Birk on: 03/16/2022 11:58 AM   Modules accepted: Orders

## 2022-04-06 ENCOUNTER — Telehealth: Payer: Self-pay | Admitting: *Deleted

## 2022-04-06 NOTE — Telephone Encounter (Signed)
Urine drug screen for this encounter is consistent for  no prescribed or illicit medication

## 2022-04-27 ENCOUNTER — Encounter
Payer: Managed Care, Other (non HMO) | Attending: Physical Medicine and Rehabilitation | Admitting: Physical Medicine & Rehabilitation

## 2022-04-27 ENCOUNTER — Encounter: Payer: Self-pay | Admitting: Physical Medicine & Rehabilitation

## 2022-04-27 VITALS — BP 122/81 | HR 72 | Temp 98.1°F | Ht 69.0 in | Wt 140.0 lb

## 2022-04-27 DIAGNOSIS — M533 Sacrococcygeal disorders, not elsewhere classified: Secondary | ICD-10-CM | POA: Insufficient documentation

## 2022-04-27 MED ORDER — LIDOCAINE HCL 1 % IJ SOLN
4.0000 mL | Freq: Once | INTRAMUSCULAR | Status: AC
  Administered 2022-04-27: 4 mL

## 2022-04-27 MED ORDER — BETAMETHASONE SOD PHOS & ACET 6 (3-3) MG/ML IJ SUSP
6.0000 mg | Freq: Once | INTRAMUSCULAR | Status: AC
  Administered 2022-04-27: 6 mg via INTRAMUSCULAR

## 2022-04-27 MED ORDER — LIDOCAINE HCL (PF) 2 % IJ SOLN
1.0000 mL | Freq: Once | INTRAMUSCULAR | Status: AC
  Administered 2022-04-27: 1 mL

## 2022-04-27 NOTE — Patient Instructions (Signed)
Sacroiliac injection was performed today. A combination of numbing medicine (lidocaine) plus a cortisone medicine (betamethasone) was injected. The injection was done under x-ray guidance. This procedure has been performed to help reduce low back and buttocks pain as well as potentially hip pain. The duration of this injection is variable lasting from hours to  Months. It may repeated if needed. 

## 2022-04-27 NOTE — Progress Notes (Signed)

## 2022-04-27 NOTE — Progress Notes (Signed)
  PROCEDURE RECORD [facility]   Name: Mariah [name] DOB:[birth year] MRN: [id number]  Date:04/27/2022  Physician: Alysia Penna, MD    Nurse/CMA: Neil Brickell RMA/Lee, CMA  Allergies:  Allergies  Allergen Reactions   Penicillins Hives and Other (See Comments)    Has patient had a PCN reaction causing immediate rash, facial/tongue/throat swelling, SOB or lightheadedness with hypotension: Unknown  Has patient had a PCN reaction causing severe rash involving mucus membranes or skin necrosis: Unknown  Has patient had a PCN reaction that required hospitalization: Unknown  Has patient had a PCN reaction occurring within the last 10 years: No  If all of the above answers are "NO", then may proceed with Cephalosporin use.    Consent Signed: Yes.    Is patient diabetic? No.  CBG today? .  Pregnant: No. LMP: No LMP recorded. (age 81-55)  Anticoagulants: no Anti-inflammatory: no Antibiotics: no  Procedure: Right Sacroiliac Join Injection   Position: Prone Start Time: 11:20 am  End Time: 11:27 am  Fluoro Time: 13  RN/CMA Kilie Rund RMA  Lee, CMA    Time 11:05 11:38 am    BP 122/81 146/96    Pulse 72 75    Respirations 16 16    O2 Sat 99 99    S/S 6 6    Pain Level 5/10 2/10     D/C home with mother, [name] & O X 3, D/C instructions reviewed, and sits independently.

## 2022-05-05 ENCOUNTER — Emergency Department (HOSPITAL_COMMUNITY)
Admission: EM | Admit: 2022-05-05 | Discharge: 2022-05-05 | Disposition: A | Discharge status: Home / Self Care | Payer: Managed Care, Other (non HMO) | Attending: Emergency Medicine | Admitting: Emergency Medicine

## 2022-05-05 ENCOUNTER — Other Ambulatory Visit: Payer: Self-pay

## 2022-05-05 DIAGNOSIS — J101 Influenza due to other identified influenza virus with other respiratory manifestations: Secondary | ICD-10-CM | POA: Diagnosis not present

## 2022-05-05 DIAGNOSIS — Z1152 Encounter for screening for COVID-19: Secondary | ICD-10-CM | POA: Diagnosis not present

## 2022-05-05 DIAGNOSIS — R0981 Nasal congestion: Secondary | ICD-10-CM | POA: Diagnosis present

## 2022-05-05 DIAGNOSIS — J111 Influenza due to unidentified influenza virus with other respiratory manifestations: Secondary | ICD-10-CM

## 2022-05-05 LAB — RESP PANEL BY RT-PCR (RSV, FLU A&B, COVID)  RVPGX2
Influenza A by PCR: POSITIVE — AB
Influenza B by PCR: NEGATIVE
Resp Syncytial Virus by PCR: NEGATIVE
SARS Coronavirus 2 by RT PCR: NEGATIVE

## 2022-05-05 MED ORDER — BENZONATATE 100 MG PO CAPS: 100.0000 mg | ORAL_CAPSULE | Freq: Three times a day (TID) | ORAL | 0 refills | Status: DC

## 2022-05-05 MED ORDER — OSELTAMIVIR PHOSPHATE 75 MG PO CAPS: 75.0000 mg | ORAL_CAPSULE | Freq: Two times a day (BID) | ORAL | 0 refills | Status: DC

## 2022-05-05 NOTE — Discharge Instructions (Signed)
Take Tamiflu twice a day for 5 days, you may use Flonase nasal spray Zyrtec and Sudafed to help with nasal congestion, benzonatate for the cough.  You will likely be sick for about a week, please read the attached instructions  Please stay out of work until you are fever free for 24 hours without medications  Thank you for allowing [place] to treat you in the emergency department today.  After reviewing your examination and potential testing that was done it appears that you are safe to go home.  I would like for you to follow-up with your doctor within the next several days, have them obtain your results and follow-up with them to review all of these tests.  If you should develop severe or worsening symptoms return to the emergency department immediately

## 2022-05-05 NOTE — ED Triage Notes (Signed)
Pt to ED c/o flu symptoms x 1 day,. Reports cough, generalized body aches. Reports husband just tested positive for flu.

## 2022-05-05 NOTE — ED Provider Notes (Signed)
[facility] EMERGENCY DEPARTMENT Provider Note   CSN: [id number] Arrival date & time: 05/05/22  1655     History  Chief Complaint  Patient presents with   flu sx    Mariah [name] is a 47 y.o. female.  HPI   This patient is a 47 year old female, history of about 6 hours of symptoms including fevers chills body aches nasal congestion and drainage.  Her significant other was diagnosed with flu this week.  No nausea vomiting or diarrhea, no medications prior to arrival.  Home Medications Prior to Admission medications   Medication Sig Start Date End Date Taking? Authorizing Provider  benzonatate (TESSALON) 100 MG capsule Take 1 capsule (100 mg total) by mouth every 8 (eight) hours. 05/05/22  Yes Noemi Chapel, MD  oseltamivir (TAMIFLU) 75 MG capsule Take 1 capsule (75 mg total) by mouth every 12 (twelve) hours. 05/05/22  Yes Noemi Chapel, MD  acetaminophen (TYLENOL) 325 MG tablet Take 650 mg by mouth every 6 (six) hours as needed for moderate pain or headache.     [provider]  baclofen (LIORESAL) 10 MG tablet Take 1 tablet (10 mg total) by mouth 3 (three) times daily as needed for muscle spasms. 02/28/22   Coral Spikes, DO  meloxicam (MOBIC) 15 MG tablet Take 1 tablet (15 mg total) by mouth daily. 02/28/22   Coral Spikes, DO  traMADol (ULTRAM) 50 MG tablet Take 1 tablet (50 mg total) by mouth every 12 (twelve) hours as needed for moderate pain or severe pain. 03/16/22   Gertie Gowda, DO      Allergies    Penicillins    Review of Systems   Review of Systems  All other systems reviewed and are negative.   Physical Exam Updated Vital Signs BP 135/80 (BP Location: Right Arm)   Pulse (!) 104   Temp (!) 100.9 F (38.3 C) (Oral)   Resp 18   Ht 1.753 m (5\' 9" )   Wt 63.5 kg   LMP 04/27/2022   SpO2 97%   BMI 20.67 kg/m  Physical Exam Vitals and nursing note reviewed.  Constitutional:      General: She is not in acute distress.    Appearance: She is  well-developed.  HENT:     Head: Normocephalic and atraumatic.     Nose: Rhinorrhea present.     Mouth/Throat:     Pharynx: No oropharyngeal exudate.  Eyes:     General: No scleral icterus.       Right eye: No discharge.        Left eye: No discharge.     Conjunctiva/sclera: Conjunctivae normal.     Pupils: Pupils are equal, round, and reactive to light.  Neck:     Thyroid: No thyromegaly.     Vascular: No JVD.  Cardiovascular:     Rate and Rhythm: Regular rhythm. Tachycardia present.     Heart sounds: Normal heart sounds. No murmur heard.    No friction rub. No gallop.  Pulmonary:     Effort: Pulmonary effort is normal. No respiratory distress.     Breath sounds: Normal breath sounds. No wheezing or rales.  Abdominal:     General: Bowel sounds are normal. There is no distension.     Palpations: Abdomen is soft. There is no mass.     Tenderness: There is no abdominal tenderness.  Musculoskeletal:        General: No tenderness. Normal range of motion.  Cervical back: Normal range of motion and neck supple.  Lymphadenopathy:     Cervical: No cervical adenopathy.  Skin:    General: Skin is warm and dry.     Findings: No erythema or rash.  Neurological:     Mental Status: She is alert.     Coordination: Coordination normal.  Psychiatric:        Behavior: Behavior normal.     ED Results / Procedures / Treatments   Labs (all labs ordered are listed, but only abnormal results are displayed) Labs Reviewed  RESP PANEL BY RT-PCR (RSV, FLU A&B, COVID)  RVPGX2 - Abnormal; Notable for the following components:      Result Value   Influenza A by PCR POSITIVE (*)    All other components within normal limits    EKG None  Radiology No results found.  Procedures Procedures    Medications Ordered in ED Medications - No data to display  ED Course/ Medical Decision Making/ A&P                             Medical Decision Making Risk Prescription drug  management.   Positive for flu Mild tachycardia but has clear flulike illness and is well-appearing without signs of sepsis or respiratory distress Normal lung sounds and no hypoxia Tamiflu, Tessalon, Flonase, Sudafed, stable for discharge, patient agreeable        Final Clinical Impression(s) / ED Diagnoses Final diagnoses:  Influenza    Rx / DC Orders ED Discharge Orders          Ordered    oseltamivir (TAMIFLU) 75 MG capsule  Every 12 hours        05/05/22 2032    benzonatate (TESSALON) 100 MG capsule  Every 8 hours        05/05/22 2032              Noemi Chapel, MD 05/05/22 2033

## 2022-05-05 NOTE — ED Notes (Signed)
Pt d/c home per MD order. Discharge summary reviewed, pt verbalizes understanding. Ambulatory off unit. No s/s of acute distress noted at discharge.  °

## 2022-05-12 ENCOUNTER — Encounter: Payer: Self-pay | Admitting: Family Medicine

## 2022-05-12 ENCOUNTER — Ambulatory Visit (INDEPENDENT_AMBULATORY_CARE_PROVIDER_SITE_OTHER): Payer: 59 | Admitting: Family Medicine

## 2022-05-12 ENCOUNTER — Ambulatory Visit (HOSPITAL_COMMUNITY)
Admission: RE | Admit: 2022-05-12 | Discharge: 2022-05-12 | Disposition: A | Discharge status: Home / Self Care | Payer: Managed Care, Other (non HMO) | Source: Ambulatory Visit | Attending: Family Medicine | Admitting: Family Medicine

## 2022-05-12 VITALS — BP 112/78 | HR 90 | Temp 98.4°F | Wt 138.6 lb

## 2022-05-12 DIAGNOSIS — R051 Acute cough: Secondary | ICD-10-CM | POA: Diagnosis present

## 2022-05-12 MED ORDER — PROMETHAZINE-DM 6.25-15 MG/5ML PO SYRP: 5.0000 mL | ORAL_SOLUTION | Freq: Four times a day (QID) | ORAL | 0 refills | Status: DC | PRN

## 2022-05-12 NOTE — Patient Instructions (Signed)
Xray today.  Cough medication as prescribed.  We will be in contact with results.

## 2022-05-14 DIAGNOSIS — R051 Acute cough: Secondary | ICD-10-CM | POA: Insufficient documentation

## 2022-05-14 NOTE — Assessment & Plan Note (Signed)
Patient was recent influenza.  Given persistent cough, chest x-ray was obtained.  Chest x-ray was independently reviewed by me.  Interpretation: No evidence of infiltrate.  Promethazine DM for cough.  Supportive care.

## 2022-05-14 NOTE — Progress Notes (Signed)
Subjective:  Patient ID: Mariah [name], female    DOB: [birth year]  Age: 47 y.o. MRN: [id number]  CC: Chief Complaint  Patient presents with   Influenza    Pt tested postive for flu 05/05/22. Pt felt better then began to feel bad again on Tuesday this week. Cough at night, diarrhea when eating, shortness of breath at times.     HPI:  47 year old female presents for evaluation of the above.  Patient seen on 1/12 in the ER.  Diagnosed with influenza.  Patient reports that she continues to feel poorly.  She is having significant cough which is worse at night.  She states that she has had ongoing fever, diarrhea, and SOB. No relieving factors. No other complaints.   Patient Active Problem List   Diagnosis Date Noted   Acute cough 05/14/2022   Pain of right sacroiliac joint 03/13/2022   Chronic pain syndrome 03/13/2022   S/P total knee arthroplasty, left 03/01/2022   Chronic bilateral low back pain without sciatica 03/01/2022   History of multiple miscarriages 07/04/2017    Social Hx   Social History   Socioeconomic History   Marital status: Married    Spouse name: Not on file   Number of children: Not on file   Years of education: Not on file   Highest education level: Not on file  Occupational History   Not on file  Tobacco Use   Smoking status: Former    Types: Cigarettes    Quit date: 05/27/2014    Years since quitting: 7.9   Smokeless tobacco: Never  Vaping Use   Vaping Use: Never used  Substance and Sexual Activity   Alcohol use: Not Currently   Drug use: No   Sexual activity: Not Currently    Birth control/protection: Surgical    Comment: tubal  Other Topics Concern   Not on file  Social History Narrative   Not on file   Social Determinants of Health   Financial Resource Strain: [place]  (01/29/2018)   Overall Financial Resource Strain (CARDIA)    Difficulty of Paying Living Expenses: Not hard at all  Food Insecurity: No Food Insecurity (01/29/2018)    Hunger Vital Sign    Worried About [place] in the Last Year: Never true    [place] in the Last Year: Never true  Transportation Needs: Unknown (01/29/2018)   PRAPARE - Hydrologist (Medical): No    Lack of Transportation (Non-Medical): Not on file  Physical Activity: Not on file  Stress: No Stress Concern Present (01/29/2018)   [place]    Feeling of Stress : Only a little  Social Connections: Not on file    Review of Systems Per HPI  Objective:  BP 112/78   Pulse 90   Temp 98.4 F (36.9 C)   Wt 138 lb 9.6 oz (62.9 kg)   LMP 04/27/2022   SpO2 98%   BMI 20.47 kg/m      05/12/2022    3:25 PM 05/05/2022    8:55 PM 05/05/2022    5:23 PM  BP/Weight  Systolic BP 326 712 458  Diastolic BP 78 82 80  Wt. (Lbs) 138.6  140  BMI 20.47 kg/m2  20.67 kg/m2    Physical Exam Vitals and nursing note reviewed.  Constitutional:      General: She is not in acute distress.    Appearance: Normal  appearance.  HENT:     Head: Normocephalic and atraumatic.  Eyes:     General:        Right eye: No discharge.        Left eye: No discharge.     Conjunctiva/sclera: Conjunctivae normal.  Cardiovascular:     Rate and Rhythm: Normal rate and regular rhythm.  Pulmonary:     Effort: Pulmonary effort is normal.     Breath sounds: Normal breath sounds. No wheezing, rhonchi or rales.  Neurological:     Mental Status: She is alert.  Psychiatric:        Mood and Affect: Mood normal.        Behavior: Behavior normal.     Lab Results  Component Value Date   WBC 14.8 (H) 05/07/2019   HGB 13.1 05/07/2019   HCT 40.9 05/07/2019   PLT 160 05/07/2019   GLUCOSE 143 (H) 05/07/2019   ALT 17 05/07/2019   AST 18 05/07/2019   NA 134 (L) 05/07/2019   K 4.7 05/07/2019   CL 100 05/07/2019   CREATININE 1.10 (H) 05/07/2019   BUN 15 05/07/2019   CO2 25 05/07/2019     Assessment &  Plan:   Problem List Items Addressed This Visit       Other   Acute cough - Primary    Patient was recent influenza.  Given persistent cough, chest x-ray was obtained.  Chest x-ray was independently reviewed by me.  Interpretation: No evidence of infiltrate.  Promethazine DM for cough.  Supportive care.      Relevant Orders   DG Chest 2 View (Completed)    Meds ordered this encounter  Medications   promethazine-dextromethorphan (PROMETHAZINE-DM) 6.25-15 MG/5ML syrup    Sig: Take 5 mLs by mouth 4 (four) times daily as needed for cough.    Dispense:  118 mL    Refill:  0    Follow-up:  Return if symptoms worsen or fail to improve.  [place]

## 2022-06-12 ENCOUNTER — Ambulatory Visit: Payer: 59 | Admitting: Physical Medicine and Rehabilitation

## 2022-06-16 NOTE — Progress Notes (Deleted)
Subjective:    Patient ID: Mariah [name], female    DOB: 09/30/1975, 47 y.o.   MRN: [id number]  HPI   Pain Inventory Average Pain {NUMBERS; 0-10:5044} Pain Right Now {NUMBERS; 0-10:5044} My pain is {PAIN DESCRIPTION:21022940}  In the last 24 hours, has pain interfered with the following? General activity {NUMBERS; 0-10:5044} Relation with others {NUMBERS; 0-10:5044} Enjoyment of life {NUMBERS; 0-10:5044} What TIME of day is your pain at its worst? {time of day:24191} Sleep (in general) {BHH GOOD/FAIR/POOR:22877}  Pain is worse with: {ACTIVITIES:21022942} Pain improves with: {PAIN IMPROVES BW:[id number] Relief from Meds: {NUMBERS; 0-10:5044}  Family History  Problem Relation Age of Onset   Heart disease Paternal Grandfather    Heart attack Paternal Grandfather    Diabetes Paternal Grandmother    Hypertension Paternal Grandmother    Asthma Paternal Grandmother    Cancer Paternal Grandmother        SKIN    Atrial fibrillation Paternal Grandmother        PACEMAKER   Hypertension Father    COPD Father    Heart attack Father    Diabetes Mother    Hypertension Mother    Mental illness Mother        bipolar   Cancer Brother        skin   Hypertension Brother    Cancer Paternal Aunt        brain    Cancer Paternal Uncle        bone   Social History   Socioeconomic History   Marital status: Married    Spouse name: Not on file   Number of children: Not on file   Years of education: Not on file   Highest education level: Not on file  Occupational History   Not on file  Tobacco Use   Smoking status: Former    Types: Cigarettes    Quit date: 05/27/2014    Years since quitting: 8.0   Smokeless tobacco: Never  Vaping Use   Vaping Use: Never used  Substance and Sexual Activity   Alcohol use: Not Currently   Drug use: No   Sexual activity: Not Currently    Birth control/protection: Surgical    Comment: tubal  Other Topics Concern   Not on file  Social  History Narrative   Not on file   Social Determinants of Health   Financial Resource Strain: Low Risk  (01/29/2018)   Overall Financial Resource Strain (CARDIA)    Difficulty of Paying Living Expenses: Not hard at all  Food Insecurity: No Food Insecurity (01/29/2018)   Hunger Vital Sign    Worried About Running Out of Food in the Last Year: Never true    Ran Out of Food in the Last Year: Never true  Transportation Needs: Unknown (01/29/2018)   PRAPARE - Hydrologist (Medical): No    Lack of Transportation (Non-Medical): Not on file  Physical Activity: Not on file  Stress: No Stress Concern Present (01/29/2018)   [place]    Feeling of Stress : Only a little  Social Connections: Not on file   Past Surgical History:  Procedure Laterality Date   CESAREAN SECTION     CESAREAN SECTION WITH BILATERAL TUBAL LIGATION N/A 02/04/2018   Procedure: CESAREAN SECTION WITH BILATERAL TUBAL LIGATION;  Surgeon: Florian Buff, MD;  Location: [facility];  Service: Obstetrics;  Laterality: N/A;   KNEE ARTHROSCOPY  WISDOM TOOTH EXTRACTION     Past Surgical History:  Procedure Laterality Date   CESAREAN SECTION     CESAREAN SECTION WITH BILATERAL TUBAL LIGATION N/A 02/04/2018   Procedure: CESAREAN SECTION WITH BILATERAL TUBAL LIGATION;  Surgeon: Florian Buff, MD;  Location: [facility];  Service: Obstetrics;  Laterality: N/A;   KNEE ARTHROSCOPY     WISDOM TOOTH EXTRACTION     Past Medical History:  Diagnosis Date   Anxiety    takes no meds   Chronic chest wall pain    Headache    migraines   Vertigo    There were no vitals taken for this visit.  Opioid Risk Score:   Fall Risk Score:  `1  Depression screen [facility] 2/9     03/13/2022   11:34 AM 02/28/2022    2:17 PM 08/02/2017   10:06 AM  Depression screen PHQ 2/9  Decreased Interest 1 0 0  Down, Depressed, Hopeless 0 0 0   PHQ - 2 Score 1 0 0  Altered sleeping 2  0  Tired, decreased energy 1  1  Change in appetite 1  0  Feeling bad or failure about yourself  0  0  Trouble concentrating 0  1  Moving slowly or fidgety/restless 1  0  Suicidal thoughts 0  0  PHQ-9 Score 6  2  Difficult doing work/chores Very difficult  Not difficult at all    Review of Systems     Objective:   Physical Exam        Assessment & Plan:

## 2022-06-19 ENCOUNTER — Encounter: Payer: Managed Care, Other (non HMO) | Admitting: Physical Medicine and Rehabilitation

## 2022-06-30 ENCOUNTER — Encounter: Payer: Self-pay | Admitting: Physical Medicine and Rehabilitation

## 2022-06-30 DIAGNOSIS — G8929 Other chronic pain: Secondary | ICD-10-CM

## 2022-06-30 MED ORDER — TRAMADOL HCL 50 MG PO TABS: 50.0000 mg | ORAL_TABLET | Freq: Two times a day (BID) | ORAL | 0 refills | Status: DC | PRN

## 2022-06-30 MED ORDER — METHYLPREDNISOLONE 4 MG PO TBPK: ORAL_TABLET | ORAL | 0 refills | Status: DC

## 2022-06-30 NOTE — Addendum Note (Signed)
Addended by: Durel Salts on: 06/30/2022 04:28 PM   Modules accepted: Orders

## 2022-07-03 ENCOUNTER — Telehealth: Payer: Self-pay | Admitting: *Deleted

## 2022-07-03 NOTE — Telephone Encounter (Signed)
PA initiated for Tramadol via covermymeds Contact plan to follow up on [id number]

## 2022-07-04 ENCOUNTER — Encounter: Payer: Self-pay | Admitting: Physical Medicine and Rehabilitation

## 2022-07-04 ENCOUNTER — Encounter
Payer: Managed Care, Other (non HMO) | Attending: Physical Medicine and Rehabilitation | Admitting: Physical Medicine and Rehabilitation

## 2022-07-04 VITALS — BP 135/84 | HR 98 | Ht 69.0 in | Wt 137.0 lb

## 2022-07-04 DIAGNOSIS — Z79891 Long term (current) use of opiate analgesic: Secondary | ICD-10-CM | POA: Insufficient documentation

## 2022-07-04 DIAGNOSIS — G8929 Other chronic pain: Secondary | ICD-10-CM | POA: Insufficient documentation

## 2022-07-04 DIAGNOSIS — M545 Low back pain, unspecified: Secondary | ICD-10-CM | POA: Insufficient documentation

## 2022-07-04 DIAGNOSIS — G894 Chronic pain syndrome: Secondary | ICD-10-CM | POA: Diagnosis not present

## 2022-07-04 DIAGNOSIS — M533 Sacrococcygeal disorders, not elsewhere classified: Secondary | ICD-10-CM | POA: Diagnosis not present

## 2022-07-04 DIAGNOSIS — Z5181 Encounter for therapeutic drug level monitoring: Secondary | ICD-10-CM | POA: Insufficient documentation

## 2022-07-04 NOTE — Progress Notes (Signed)
Mariah [name] is a 47 y.o. year old female  who  has a past medical history of Anxiety, Chronic chest wall pain, Headache, and Vertigo.   They are presenting to PM&R clinic for follow up related to chronic low back pain . Patient has reached out multiple times this past week regarding working back pain uncontrolled with her current regimen and need for work excuse through next week, prompting need for a visit today.   Plan from last visit:    Chronic pain syndrome Assessment & Plan: Indication for chronic opioid: Chronic low back pain Medication and dose: Tramadol 50 mg BID PRN # pills per month: 60 Last UDS date: 03/13/22 Opioid Treatment Agreement signed (Y/N): Y, 05/13/21 Opioid Treatment Agreement last reviewed with patient:   NCCSRS/PDMP reviewed this encounter (include red flags): Yes    Low Risk (<10 MME) UDS every 6-12 months NCCSR check every visit Follow up Q3M initially, Q6M once established    Orders: -     ToxAssure Select,+Antidepr,UR   Pain of right sacroiliac joint Assessment & Plan: I will be getting xrays of your back and referring you to Dr. Letta Pate for a right sacroiliac steroid injection. If this goes well, we can repeat it on the other side and perform every 3-6 months.   Once you have this injection, if pain is improved at follow up, we will refer you to PT for strengthening of your gluts and stretching to reduce your pain further.    Orders: -     DG Lumbar Spine 2-3 Views; Future   Chronic bilateral low back pain without sciatica Assessment & Plan: Today, we performed a urine drug screen and signed a pain management contract. If the urine drug screen results in 5-7 day without any unexpected substances, we will prescribe you Tramadol 50 mg tablets twice daily #60 tabs with two refills. You will follow back up in clinic prior to your next needed refill; bring your medications with you at that time for a pill count.   You will also continue your  meloxicam and use as needed tylenol up to 2000 mg daily for adjunctive pain control. You can use voltaren gel on your low back up to four times daily as well. Please avoid other NSAIDs like Ibuprofen while on meloxicam.      Orders: -     DG Lumbar Spine 2-3 Views; Future   Interval Hx:  - Therapies: none  - Follow ups:  Lumbar xray was not performed; she does think she had recent imaging in the ERR that may suffice.   - Falls:none  - DME: Has a soft back brace, uses intermittently, sometimes is more bothersome than it is worth.    - Medications: Increased Tramadol was completely inneffective. Meloxicam has been minimally helpful. Steroid dosepak had no effect.   Injection 1/4 into R SI joint with Dr. Letta Pate worked very well for about a month; then pain came back severely during her work shifts.  - Used to take oxy 10 mg TID on days she was working, 1 on days off, which kept her pain well controlled.    - Other concerns: Works [employer], [employer] every other week. She is looking for a supervisory position so she has less wear oin her back (currently lifts heavy denim rolls). Does not think there would be any reasonable accommodations to reduce lifting requirement or allow breaks at her current job.   Review of Systems  Gastrointestinal:  Negative for constipation and diarrhea.  Genitourinary:  Negative for dysuria and urgency.  Musculoskeletal:  Positive for back pain. Negative for joint pain and myalgias.  Neurological:  Negative for sensory change and focal weakness.     PE: Constitution: Appropriate appearance for age. No apparent distress  Resp: No respiratory distress. No accessory muscle usage. on RA Cardio: Well perfused appearance. No peripheral edema. Abdomen: Nondistended. Nontender.   Psych: Appropriate mood and affect. Neuro: AAOx4. No apparent cognitive deficits   Neurologic Exam:   DTRs: Reflexes were 2+ in bilateral achilles, patella Sensory exam: revealed normal  sensation in all dermatomal regions in bilateral upper extremities and bilateral lower extremities Motor exam: strength 5/5 throughout bilateral lower extremities   Gait: normal  MSK:  + TTP bilateral PSIS, SI joints R>L + Pain with facet loading and forward bending AROM back flexion, extension, and rotation WNL  Assessment and plan: Mariah [name] is a 47 y.o. year old female  who  has a past medical history of Anxiety, Chronic chest wall pain, Headache, and Vertigo.   They are presenting to PM&R clinic as a follow up for treatment of low back pain, likely from R>L sacroiliitis. She presents today with severe worsening of her pain since last visit despite injections and escalating pain management with Tramadol and steroid taper.   Chronic pain syndrome Assessment & Plan: Once results of your urine screen are back, if as expected, we will start you on Percocet 10 mg 1/2-1 tab twice a day as needed for pain control.  I will initially see you back every month until your pain is under better control, then we can extend to every 2 to 3 months.  Bring your tramadol to your next appointment with me so it can be disposed of safely.  **addendum 3/17** Urine Cr too low for accurate drug detection, but barely in physiologic range (2 mg/dl). This is consistent with her last UDS in November (7 mg/dl). Will look into alternative tests in the future and call patient this week to discuss next steps.  Orders: -     ToxAssure Select,+Antidepr,UR -     Ambulatory referral to Physical Therapy  Chronic bilateral low back pain without sciatica Assessment & Plan: I have sent in a prescription for physical therapy for your low back, which can be completed at the [facility] outpatient rehab center.  You should get a call regarding scheduling this.   Please call let me know if you have any questions or concerns.  I will provide you a work note for this past weekend and through the end of this week.  I agree  with and would encourage you to pursue positions that involve less heavy lifting and moving.  Please also consider using your back brace on weekend shifts when will be doing long hours of lifting and moving for extra support.   Pain of right sacroiliac joint Assessment & Plan:  I will get you back on the schedule with Dr. Letta Pate for a right SI joint injection.  You can get these as frequently as every 3 months.  I will also let him know to consider a right SI joint ablation if symptoms do not improve with conservative treatments.  Orders: -     Ambulatory referral to Physical Therapy  Encounter for long-term use of opiate analgesic -     ToxAssure Select,+Antidepr,UR  Encounter for therapeutic drug monitoring -     ToxAssure Select,+Antidepr,UR

## 2022-07-04 NOTE — Patient Instructions (Signed)
I have sent in a prescription for physical therapy for your low back, which can be completed at the [facility] outpatient rehab center.  You should get a call regarding scheduling this.  I will get you back on the schedule with Dr. Charleen Kirks for a right SI joint injection.  You can get these as frequently as every 3 months.  I will also let him know to consider a right SI joint ablation if symptoms do not improve with conservative treatments.  Once results of your urine screen are back, if as expected, we will start you on Percocet 10 mg 1/2-1 tab twice a day as needed for pain control.  I will initially see you back every month until your pain is under better control, then we can extend to every 2 to 3 months.  Bring your tramadol to your next appointment with me so it can be disposed of safely.  Please call let me know if you have any questions or concerns.  I will provide you a work note for this past weekend and through the end of this week.  I agree with and would encourage you to pursue positions that involve less heavy lifting and moving.  Please also consider using your back brace on weekend shifts when will be doing long hours of lifting and moving for extra support.

## 2022-07-04 NOTE — Progress Notes (Signed)
Subjective:    Patient ID: Mariah [name], female    DOB: 04/15/1976, 47 y.o.   MRN: [id number]  HPI  Mariah [name] is a 47 y.o. year old female  who  has a past medical history of Anxiety, Chronic chest wall pain, Headache, and Vertigo.   They are presenting to PM&R clinic for follow up related to chronic low back pain . Patient has reached out multiple times this past week regarding working back pain uncontrolled with her current regimen and need for work excuse through next week, prompting need for a visit today.   Plan from last visit:    Chronic pain syndrome Assessment & Plan: Indication for chronic opioid: Chronic low back pain Medication and dose: Tramadol 50 mg BID PRN # pills per month: 60 Last UDS date: 03/13/22 Opioid Treatment Agreement signed (Y/N): Y, 05/13/21 Opioid Treatment Agreement last reviewed with patient:   NCCSRS/PDMP reviewed this encounter (include red flags): Yes    Low Risk (<10 MME) UDS every 6-12 months NCCSR check every visit Follow up Q3M initially, Q6M once established    Orders: -     ToxAssure Select,+Antidepr,UR   Pain of right sacroiliac joint Assessment & Plan: I will be getting xrays of your back and referring you to Dr. Letta Pate for a right sacroiliac steroid injection. If this goes well, we can repeat it on the other side and perform every 3-6 months.   Once you have this injection, if pain is improved at follow up, we will refer you to PT for strengthening of your gluts and stretching to reduce your pain further.    Orders: -     DG Lumbar Spine 2-3 Views; Future   Chronic bilateral low back pain without sciatica Assessment & Plan: Today, we performed a urine drug screen and signed a pain management contract. If the urine drug screen results in 5-7 day without any unexpected substances, we will prescribe you Tramadol 50 mg tablets twice daily #60 tabs with two refills. You will follow back up in clinic prior to your next  needed refill; bring your medications with you at that time for a pill count.   You will also continue your meloxicam and use as needed tylenol up to 2000 mg daily for adjunctive pain control. You can use voltaren gel on your low back up to four times daily as well. Please avoid other NSAIDs like Ibuprofen while on meloxicam.      Orders: -     DG Lumbar Spine 2-3 Views; Future   Interval Hx:  - Therapies: none  - Follow ups:  Lumbar xray was not performed; she does think she had recent imaging in the [facility] that may suffice.   - Falls:none  - DME: Has a soft back brace, uses intermittently, sometimes is more bothersome than it is worth.    - Medications: Increased Tramadol was completely inneffective. Meloxicam has been minimally helpful. Steroid dosepak had no effect.   Injection 1/4 into R SI joint with Dr. Letta Pate worked very well for about a month; then pain came back severely during her work shifts.  - Used to take oxy 10 mg TID on days she was working, 1 on days off, which kept her pain well controlled.    - Other concerns: Works [employer], [employer] every other week. She is looking for a supervisory position so she has less wear oin her back (currently lifts heavy denim rolls). Does not think there would be any reasonable  accommodations to reduce lifting requirement or allow breaks at her current job.    Pain Inventory Average Pain 8 Pain Right Now 5 My pain is constant, sharp, stabbing, and aching  In the last 24 hours, has pain interfered with the following? General activity 7 Relation with others 5 Enjoyment of life 8 What TIME of day is your pain at its worst? morning , evening, and night Sleep (in general) Poor  Pain is worse with: walking, bending, sitting, standing, and some activites Pain improves with: rest, medication, and injections Relief from Meds: 1  Family History  Problem Relation Age of Onset   Heart disease Paternal Grandfather    Heart attack Paternal  Grandfather    Diabetes Paternal Grandmother    Hypertension Paternal Grandmother    Asthma Paternal Grandmother    Cancer Paternal Grandmother        SKIN    Atrial fibrillation Paternal Grandmother        PACEMAKER   Hypertension Father    COPD Father    Heart attack Father    Diabetes Mother    Hypertension Mother    Mental illness Mother        bipolar   Cancer Brother        skin   Hypertension Brother    Cancer Paternal Aunt        brain    Cancer Paternal Uncle        bone   Social History   Socioeconomic History   Marital status: Married    Spouse name: Not on file   Number of children: Not on file   Years of education: Not on file   Highest education level: Not on file  Occupational History   Not on file  Tobacco Use   Smoking status: Former    Types: Cigarettes    Quit date: 05/27/2014    Years since quitting: 8.1   Smokeless tobacco: Never  Vaping Use   Vaping Use: Never used  Substance and Sexual Activity   Alcohol use: Not Currently   Drug use: No   Sexual activity: Not Currently    Birth control/protection: Surgical    Comment: tubal  Other Topics Concern   Not on file  Social History Narrative   Not on file   Social Determinants of Health   Financial Resource Strain: Low Risk  (01/29/2018)   Overall Financial Resource Strain (CARDIA)    Difficulty of Paying Living Expenses: Not hard at all  Food Insecurity: No Food Insecurity (01/29/2018)   Hunger Vital Sign    Worried About Running Out of Food in the Last Year: Never true    Ran Out of Food in the Last Year: Never true  Transportation Needs: Unknown (01/29/2018)   PRAPARE - Hydrologist (Medical): No    Lack of Transportation (Non-Medical): Not on file  Physical Activity: Not on file  Stress: No Stress Concern Present (01/29/2018)   [place]    Feeling of Stress : Only a little  Social  Connections: Not on file   Past Surgical History:  Procedure Laterality Date   CESAREAN SECTION     CESAREAN SECTION WITH BILATERAL TUBAL LIGATION N/A 02/04/2018   Procedure: CESAREAN SECTION WITH BILATERAL TUBAL LIGATION;  Surgeon: Florian Buff, MD;  Location: [place];  Service: Obstetrics;  Laterality: N/A;   KNEE ARTHROSCOPY     WISDOM TOOTH EXTRACTION  Past Surgical History:  Procedure Laterality Date   CESAREAN SECTION     CESAREAN SECTION WITH BILATERAL TUBAL LIGATION N/A 02/04/2018   Procedure: CESAREAN SECTION WITH BILATERAL TUBAL LIGATION;  Surgeon: Florian Buff, MD;  Location: [place];  Service: Obstetrics;  Laterality: N/A;   KNEE ARTHROSCOPY     WISDOM TOOTH EXTRACTION     Past Medical History:  Diagnosis Date   Anxiety    takes no meds   Chronic chest wall pain    Headache    migraines   Vertigo    BP 135/84   Pulse 98   Ht 5\' 9"  (1.753 m)   Wt 137 lb (62.1 kg)   SpO2 98%   BMI 20.23 kg/m   Opioid Risk Score:   Fall Risk Score:  `1  Depression screen [facility] 2/9     03/13/2022   11:34 AM 02/28/2022    2:17 PM 08/02/2017   10:06 AM  Depression screen PHQ 2/9  Decreased Interest 1 0 0  Down, Depressed, Hopeless 0 0 0  PHQ - 2 Score 1 0 0  Altered sleeping 2  0  Tired, decreased energy 1  1  Change in appetite 1  0  Feeling bad or failure about yourself  0  0  Trouble concentrating 0  1  Moving slowly or fidgety/restless 1  0  Suicidal thoughts 0  0  PHQ-9 Score 6  2  Difficult doing work/chores Very difficult  Not difficult at all    Review of Systems  Musculoskeletal:  Positive for back pain.       Right hip pain  All other systems reviewed and are negative.      Objective:   Physical Exam   PE: Constitution: Appropriate appearance for age. No apparent distress  Resp: No respiratory distress. No accessory muscle usage. on RA Cardio: Well perfused appearance. No peripheral edema. Abdomen: Nondistended.  Nontender.   Psych: Appropriate mood and affect. Neuro: AAOx4. No apparent cognitive deficits   Neurologic Exam:   DTRs: Reflexes were 2+ in bilateral achilles, patella Sensory exam: revealed normal sensation in all dermatomal regions in bilateral upper extremities and bilateral lower extremities Motor exam: strength 5/5 throughout bilateral lower extremities   Gait: normal  MSK:  + TTP bilateral PSIS, SI joints R>L + Pain with facet loading and forward bending AROM back flexion, extension, and rotation WNL     Assessment & Plan:  AMBERA CONK is a 47 y.o. year old female  who  has a past medical history of Anxiety, Chronic chest wall pain, Headache, and Vertigo.   They are presenting to PM&R clinic as a follow up for treatment of low back pain, likely from R>L sacroiliitis. She presents today with severe worsening of her pain since last visit despite injections and escalating pain management with Tramadol and steroid taper.   Chronic pain syndrome Assessment & Plan: Once results of your urine screen are back, if as expected, we will start you on Percocet 10 mg 1/2-1 tab twice a day as needed for pain control.  I will initially see you back every month until your pain is under better control, then we can extend to every 2 to 3 months.  Bring your tramadol to your next appointment with me so it can be disposed of safely.  **addendum 3/17** Urine Cr too low for accurate drug detection, but barely in physiologic range (2 mg/dl). This is consistent with her last UDS in November (  7 mg/dl). Will look into alternative tests in the future and call patient this week to discuss next steps.  Orders: -     ToxAssure Select,+Antidepr,UR -     Ambulatory referral to Physical Therapy  Chronic bilateral low back pain without sciatica Assessment & Plan: I have sent in a prescription for physical therapy for your low back, which can be completed at the [facility] outpatient rehab center.  You  should get a call regarding scheduling this.   Please call let me know if you have any questions or concerns.  I will provide you a work note for this past weekend and through the end of this week.  I agree with and would encourage you to pursue positions that involve less heavy lifting and moving.  Please also consider using your back brace on weekend shifts when will be doing long hours of lifting and moving for extra support.   Pain of right sacroiliac joint Assessment & Plan:  I will get you back on the schedule with Dr. Letta Pate for a right SI joint injection.  You can get these as frequently as every 3 months.  I will also let him know to consider a right SI joint ablation if symptoms do not improve with conservative treatments.  Orders: -     Ambulatory referral to Physical Therapy  Encounter for long-term use of opiate analgesic -     ToxAssure Select,+Antidepr,UR  Encounter for therapeutic drug monitoring -     ToxAssure Select,+Antidepr,UR

## 2022-07-07 LAB — TOXASSURE SELECT,+ANTIDEPR,UR

## 2022-07-09 NOTE — Assessment & Plan Note (Signed)
  I will get you back on the schedule with Dr. Letta Pate for a right SI joint injection.  You can get these as frequently as every 3 months.  I will also let him know to consider a right SI joint ablation if symptoms do not improve with conservative treatments.

## 2022-07-09 NOTE — Assessment & Plan Note (Addendum)
I have sent in a prescription for physical therapy for your low back, which can be completed at the [facility] outpatient rehab center.  You should get a call regarding scheduling this.   Please call let me know if you have any questions or concerns.  I will provide you a work note for this past weekend and through the end of this week.  I agree with and would encourage you to pursue positions that involve less heavy lifting and moving.  Please also consider using your back brace on weekend shifts when will be doing long hours of lifting and moving for extra support.

## 2022-07-09 NOTE — Assessment & Plan Note (Addendum)
Once results of your urine screen are back, if as expected, we will start you on Percocet 10 mg 1/2-1 tab twice a day as needed for pain control.  I will initially see you back every month until your pain is under better control, then we can extend to every 2 to 3 months.  Bring your tramadol to your next appointment with me so it can be disposed of safely.  **addendum 3/17** Urine Cr too low for accurate drug detection, but barely in physiologic range (2 mg/dl). This is consistent with her last UDS in November (7 mg/dl). Will look into alternative tests in the future and call patient this week to discuss next steps.

## 2022-07-09 NOTE — Progress Notes (Incomplete)
Mariah [name] is a 47 y.o. year old female  who  has a past medical history of Anxiety, Chronic chest wall pain, Headache, and Vertigo.   They are presenting to PM&R clinic for follow up related to chronic low back pain . Patient has reached out multiple times this past week regarding working back pain uncontrolled with her current regimen and need for work excuse through next week, prompting need for a visit today.   Plan from last visit:    Chronic pain syndrome Assessment & Plan: Indication for chronic opioid: Chronic low back pain Medication and dose: Tramadol 50 mg BID PRN # pills per month: 60 Last UDS date: 03/13/22 Opioid Treatment Agreement signed (Y/N): Y, 05/13/21 Opioid Treatment Agreement last reviewed with patient:   NCCSRS/PDMP reviewed this encounter (include red flags): Yes    Low Risk (<10 MME) UDS every 6-12 months NCCSR check every visit Follow up Q3M initially, Q6M once established    Orders: -     ToxAssure Select,+Antidepr,UR   Pain of right sacroiliac joint Assessment & Plan: I will be getting xrays of your back and referring you to Dr. Letta Pate for a right sacroiliac steroid injection. If this goes well, we can repeat it on the other side and perform every 3-6 months.   Once you have this injection, if pain is improved at follow up, we will refer you to PT for strengthening of your gluts and stretching to reduce your pain further.    Orders: -     DG Lumbar Spine 2-3 Views; Future   Chronic bilateral low back pain without sciatica Assessment & Plan: Today, we performed a urine drug screen and signed a pain management contract. If the urine drug screen results in 5-7 day without any unexpected substances, we will prescribe you Tramadol 50 mg tablets twice daily #60 tabs with two refills. You will follow back up in clinic prior to your next needed refill; bring your medications with you at that time for a pill count.   You will also continue your  meloxicam and use as needed tylenol up to 2000 mg daily for adjunctive pain control. You can use voltaren gel on your low back up to four times daily as well. Please avoid other NSAIDs like Ibuprofen while on meloxicam.      Orders: -     DG Lumbar Spine 2-3 Views; Future   Interval Hx:  - Therapies: none  - Follow ups:  Lumbar xray was not performed; she does think she had recent imaging in the [facility] that may suffice.   - Falls:none  - DME: Has a soft back brace, uses intermittently, sometimes is more bothersome than it is worth.    - Medications: Increased Tramadol was completely inneffective. Meloxicam has been minimally helpful. Steroid dosepak had no effect.   Injection 1/4 into R SI joint with Dr. Letta Pate worked very well for about a month; then pain came back severely during her work shifts.  - Used to take oxy 10 mg TID on days she was working, 1 on days off, which kept her pain well controlled.    - Other concerns: Works [employer], [employer] every other week. She is looking for a supervisory position so she has less wear oin her back (currently lifts heavy denim rolls). Does not think there would be any reasonable accommodations to reduce lifting requirement or allow breaks at her current job.   Review of Systems  Gastrointestinal:  Negative for constipation and diarrhea.  Genitourinary:  Negative for dysuria and urgency.  Musculoskeletal:  Positive for back pain. Negative for joint pain and myalgias.  Neurological:  Negative for sensory change and focal weakness.     PE: Constitution: Appropriate appearance for age. No apparent distress  Resp: No respiratory distress. No accessory muscle usage. on RA Cardio: Well perfused appearance. No peripheral edema. Abdomen: Nondistended. Nontender.   Psych: Appropriate mood and affect. Neuro: AAOx4. No apparent cognitive deficits   Neurologic Exam:   DTRs: Reflexes were 2+ in bilateral achilles, patella Sensory exam: revealed normal  sensation in all dermatomal regions in bilateral upper extremities and bilateral lower extremities Motor exam: strength 5/5 throughout bilateral lower extremities   Gait: normal  MSK:  + TTP bilateral PSIS, SI joints R>L + Pain with facet loading and forward bending AROM back flexion, extension, and rotation WNL  Assessment and plan: Mariah [name] is a 47 y.o. year old female  who  has a past medical history of Anxiety, Chronic chest wall pain, Headache, and Vertigo.   They are presenting to PM&R clinic as a follow up for treatment of low back pain, likely from R>L sacroiliitis. She presents today with severe worsening of her pain since last visit despite injections and escalating pain management with Tramadol and steroid taper.   Encounter for long-term use of opiate analgesic -     ToxAssure Select,+Antidepr,UR  Chronic bilateral low back pain without sciatica  Pain of right sacroiliac joint -     Ambulatory referral to Physical Therapy  Chronic pain syndrome -     ToxAssure Select,+Antidepr,UR -     Ambulatory referral to Physical Therapy  Encounter for therapeutic drug monitoring -     ToxAssure Select,+Antidepr,UR

## 2022-07-10 ENCOUNTER — Telehealth: Payer: Self-pay | Admitting: *Deleted

## 2022-07-10 MED ORDER — OXYCODONE-ACETAMINOPHEN 5-325 MG PO TABS: 0.5000 | ORAL_TABLET | Freq: Two times a day (BID) | ORAL | 0 refills | Status: AC | PRN

## 2022-07-10 NOTE — Addendum Note (Signed)
Addended by: Durel Salts on: 07/10/2022 12:40 PM   Modules accepted: Orders

## 2022-07-10 NOTE — Telephone Encounter (Signed)
Mariah [name] ([name] [id number] Pregabalin 75MG  capsules Status: PA Response - DeniedCreated: March 15th, 2024 [id number] Sent: March 18th, 2024

## 2022-07-10 NOTE — Telephone Encounter (Signed)
PA Initiated for Hydrocodone

## 2022-07-19 ENCOUNTER — Ambulatory Visit: Payer: 59 | Admitting: Physical Medicine and Rehabilitation

## 2022-07-21 ENCOUNTER — Ambulatory Visit (HOSPITAL_COMMUNITY): Payer: Managed Care, Other (non HMO) | Admitting: Physical Therapy

## 2022-07-21 NOTE — Therapy (Deleted)
OUTPATIENT PHYSICAL THERAPY THORACOLUMBAR EVALUATION   Patient Name: Mariah [name] MRN: [id number] DOB:Jan 01, 1976, 47 y.o., female Today's Date: 07/21/2022  END OF SESSION:   Past Medical History:  Diagnosis Date   Anxiety    takes no meds   Chronic chest wall pain    Headache    migraines   Vertigo    Past Surgical History:  Procedure Laterality Date   CESAREAN SECTION     CESAREAN SECTION WITH BILATERAL TUBAL LIGATION N/A 02/04/2018   Procedure: CESAREAN SECTION WITH BILATERAL TUBAL LIGATION;  Surgeon: Florian Buff, MD;  Location: [place];  Service: Obstetrics;  Laterality: N/A;   KNEE ARTHROSCOPY     WISDOM TOOTH EXTRACTION     Patient Active Problem List   Diagnosis Date Noted   Acute cough 05/14/2022   Pain of right sacroiliac joint 03/13/2022   Chronic pain syndrome 03/13/2022   S/P total knee arthroplasty, left 03/01/2022   Chronic bilateral low back pain without sciatica 03/01/2022   History of multiple miscarriages 07/04/2017    PCP: Thersa Salt  REFERRING PROVIDER: Gertie Gowda, DO  REFERRING DIAG: Chronic R sacroiliitis, low back pain  Rationale for Evaluation and Treatment: Rehabilitation  THERAPY DIAG:  Low back pain   ONSET DATE: chronic                                                                                                                                                                                             SUBJECTIVE STATEMENT: ***  PERTINENT HISTORY:  Unremarkable   PAIN:  Are you having pain? Yes: NPRS scale: ***/10 Pain location: *** Pain description: *** Aggravating factors: *** Relieving factors: ***  PRECAUTIONS: None  WEIGHT BEARING RESTRICTIONS: No  FALLS:  Has patient fallen in last 6 months? No  OCCUPATION: ***  PLOF: Independent  PATIENT GOALS: less pain  NEXT MD VISIT: as needed   OBJECTIVE:   DIAGNOSTIC FINDINGS:  ***  PATIENT SURVEYS:  {rehab  surveys:24030}   COGNITION: Overall cognitive status: Within functional limits for tasks assessed     MUSCLE LENGTH: Hamstrings: Right *** deg; Left *** deg Marcello Moores test: Right *** deg; Left *** deg  POSTURE: {posture:25561}  PALPATION: Tender to Rt SI  LUMBAR ROM:   AROM eval  Flexion   Extension   Right lateral flexion   Left lateral flexion   Right rotation   Left rotation    (Blank rows = not tested)   LOWER EXTREMITY MMT:    MMT Right eval Left eval  Hip flexion    Hip extension    Hip abduction  Hip adduction    Hip internal rotation    Hip external rotation    Knee flexion    Knee extension    Ankle dorsiflexion    Ankle plantarflexion    Ankle inversion    Ankle eversion     (Blank rows = not tested)   FUNCTIONAL TESTS:  30 seconds chair stand test 2 minute walk test: *** Single leg stance    TODAY'S TREATMENT:                                                                                                                              DATE:  07/21/22 Evaluation     PATIENT EDUCATION:  Education details: HEP Person educated: Patient Education method: Explanation Education comprehension: returned demonstration  HOME EXERCISE PROGRAM: ***  ASSESSMENT:  CLINICAL IMPRESSION: Patient is a 47 y.o. female who was seen today for physical therapy evaluation and treatment for chronic low back pain.  PT exhibits postural deficits indicative of SI dysfunction, decreased ROM, decreased strength and increased pain.  Ms. Linne will benefit from skilled PT to address these factors to decrease her pain and improve her quality of life. .   OBJECTIVE IMPAIRMENTS: decreased activity tolerance, decreased ROM, decreased strength, and pain.   ACTIVITY LIMITATIONS: stairs and locomotion level  PARTICIPATION LIMITATIONS: shopping, community activity, and occupation  PERSONAL FACTORS: Time since onset of injury/illness/exacerbation are also affecting  patient's functional outcome.   REHAB POTENTIAL: Good  CLINICAL DECISION MAKING: Stable/uncomplicated  EVALUATION COMPLEXITY: Moderate   GOALS: Goals reviewed with patient? No  SHORT TERM GOALS: Target date: 08/11/22  PT to be I in HEP to decrease her pain level to no greater than a  Baseline: Goal status: {GOALSTATUS:25110}  2.  PT LE and core strength to be increased 1/2 grade to be able to  Baseline:  Goal status: {GOALSTATUS:25110}  3.  *** Baseline:  Goal status: {GOALSTATUS:25110}    LONG TERM GOALS: Target date: 09/01/22  PT to be I in an advanced HEP to decrease her pain level to no greater than a  Baseline:  Goal status: {GOALSTATUS:25110}  2.    PT LE and core strength to be increased 1 grade to be able to  Baseline:  Goal status: {GOALSTATUS:25110}  3.  *** Baseline:  Goal status: {GOALSTATUS:25110}  4.  *** Baseline:  Goal status: {GOALSTATUS:25110}  5.  *** Baseline:  Goal status: {GOALSTATUS:25110}  6.  *** Baseline:  Goal status: {GOALSTATUS:25110}  PLAN:  PT FREQUENCY: 2x/week  PT DURATION: 6 weeks  PLANNED INTERVENTIONS: Therapeutic exercises, Patient/Family education, Self Care, Joint mobilization, and Manual therapy.  PLAN FOR NEXT SESSION: continue with core strengthening, education on body mechanics for bed mobility and lifting.  Rayetta Humphrey, [place] CLT [phone]

## 2022-07-25 ENCOUNTER — Telehealth: Payer: Self-pay

## 2022-07-25 NOTE — Telephone Encounter (Signed)
Approved 07/24/22-01/23/23

## 2022-07-31 ENCOUNTER — Encounter: Payer: Managed Care, Other (non HMO) | Admitting: Physical Medicine and Rehabilitation

## 2022-08-10 MED ORDER — OXYCODONE-ACETAMINOPHEN 5-325 MG PO TABS: 0.5000 | ORAL_TABLET | Freq: Two times a day (BID) | ORAL | 0 refills | Status: DC | PRN

## 2022-08-10 NOTE — Addendum Note (Signed)
Addended by: Elijah Birk on: 08/10/2022 05:04 PM   Modules accepted: Orders

## 2022-08-10 NOTE — Therapy (Deleted)
1 

## 2022-08-11 ENCOUNTER — Ambulatory Visit (HOSPITAL_COMMUNITY): Payer: Managed Care, Other (non HMO) | Admitting: Physical Therapy

## 2022-08-14 ENCOUNTER — Encounter
Payer: Managed Care, Other (non HMO) | Attending: Physical Medicine and Rehabilitation | Admitting: Physical Medicine and Rehabilitation

## 2022-08-14 DIAGNOSIS — M545 Low back pain, unspecified: Secondary | ICD-10-CM | POA: Insufficient documentation

## 2022-08-14 DIAGNOSIS — Z5181 Encounter for therapeutic drug level monitoring: Secondary | ICD-10-CM | POA: Insufficient documentation

## 2022-08-14 DIAGNOSIS — Z79891 Long term (current) use of opiate analgesic: Secondary | ICD-10-CM | POA: Insufficient documentation

## 2022-08-14 DIAGNOSIS — G894 Chronic pain syndrome: Secondary | ICD-10-CM | POA: Insufficient documentation

## 2022-08-14 DIAGNOSIS — G8929 Other chronic pain: Secondary | ICD-10-CM | POA: Insufficient documentation

## 2022-08-14 DIAGNOSIS — M533 Sacrococcygeal disorders, not elsewhere classified: Secondary | ICD-10-CM | POA: Insufficient documentation

## 2022-08-15 ENCOUNTER — Encounter: Payer: Managed Care, Other (non HMO) | Admitting: Physical Medicine & Rehabilitation

## 2022-08-17 ENCOUNTER — Encounter: Payer: Self-pay | Admitting: Adult Health

## 2022-08-17 ENCOUNTER — Ambulatory Visit: Payer: Managed Care, Other (non HMO) | Admitting: Adult Health

## 2022-08-17 ENCOUNTER — Other Ambulatory Visit (HOSPITAL_COMMUNITY)
Admission: RE | Admit: 2022-08-17 | Discharge: 2022-08-17 | Disposition: A | Discharge status: Home / Self Care | Payer: Managed Care, Other (non HMO) | Source: Ambulatory Visit | Attending: Adult Health | Admitting: Adult Health

## 2022-08-17 VITALS — BP 136/94 | HR 84 | Ht 69.0 in | Wt 141.5 lb

## 2022-08-17 DIAGNOSIS — R630 Anorexia: Secondary | ICD-10-CM

## 2022-08-17 DIAGNOSIS — Z124 Encounter for screening for malignant neoplasm of cervix: Secondary | ICD-10-CM | POA: Insufficient documentation

## 2022-08-17 DIAGNOSIS — N946 Dysmenorrhea, unspecified: Secondary | ICD-10-CM | POA: Diagnosis not present

## 2022-08-17 DIAGNOSIS — R5383 Other fatigue: Secondary | ICD-10-CM

## 2022-08-17 DIAGNOSIS — F419 Anxiety disorder, unspecified: Secondary | ICD-10-CM

## 2022-08-17 DIAGNOSIS — Z13220 Encounter for screening for lipoid disorders: Secondary | ICD-10-CM

## 2022-08-17 DIAGNOSIS — F32A Depression, unspecified: Secondary | ICD-10-CM

## 2022-08-17 DIAGNOSIS — R519 Headache, unspecified: Secondary | ICD-10-CM

## 2022-08-17 DIAGNOSIS — R61 Generalized hyperhidrosis: Secondary | ICD-10-CM | POA: Diagnosis not present

## 2022-08-17 DIAGNOSIS — N951 Menopausal and female climacteric states: Secondary | ICD-10-CM | POA: Insufficient documentation

## 2022-08-17 DIAGNOSIS — Z3202 Encounter for pregnancy test, result negative: Secondary | ICD-10-CM | POA: Diagnosis not present

## 2022-08-17 DIAGNOSIS — R6882 Decreased libido: Secondary | ICD-10-CM

## 2022-08-17 LAB — POCT URINE PREGNANCY: Preg Test, Ur: NEGATIVE

## 2022-08-17 MED ORDER — NORETHINDRONE 0.35 MG PO TABS: 1.0000 | ORAL_TABLET | Freq: Every day | ORAL | 11 refills | Status: DC

## 2022-08-17 MED ORDER — ESCITALOPRAM OXALATE 10 MG PO TABS: 10.0000 mg | ORAL_TABLET | Freq: Every day | ORAL | 2 refills | Status: DC

## 2022-08-17 NOTE — Progress Notes (Signed)
Subjective:     Patient ID: Mariah [name], female   DOB: 04-01-1976, 47 y.o.   MRN: [id number]  HPI Mariah [name] is a 47 year old white female, married, [id number] in complaining of night sweats, no appetite, decreased sex drive, headaches and painful periods, that are shorter. She is tired and not sleeping due to night sweats. She needs a pap too.  PCP is Dr Adriana Simas   Review of Systems +night sweats +tired   no appetite  decreased sex drive +headaches  + painful periods, that are shorter. She is tired and not sleeping due to night sweats.  Reviewed past medical,surgical, social and family history. Reviewed medications and allergies.  Objective:   Physical Exam BP (!) 136/94 (BP Location: Left Arm, Patient Position: Sitting, Cuff Size: Normal)   Pulse 84   Ht  (1.753 m)   Wt 141 lb 8 oz (64.2 kg)   LMP 08/13/2022   Breastfeeding No   BMI 20.90 kg/m  UPT is negative.   Skin warm and dry.  Lungs: clear to ausculation bilaterally. Cardiovascular: regular rate and rhythm. Pelvic: external genitalia is normal in appearance no lesions, vagina: scant blood,urethra has no lesions or masses noted, cervix:smooth, pap with GC/CHL and HR HPV genotyping performed, uterus: normal size, shape and contour, non tender, no masses felt, adnexa: no masses or tenderness noted. Bladder is non tender and no masses felt.  AA is 3 Fall risk is low    08/17/2022   10:59 AM 07/04/2022    1:19 PM 03/13/2022   11:34 AM  Depression screen PHQ 2/9  Decreased Interest 1 0 1  Down, Depressed, Hopeless 1 0 0  PHQ - 2 Score 2 0 1  Altered sleeping 2  2  Tired, decreased energy 2  1  Change in appetite 2  1  Feeling bad or failure about yourself  0  0  Trouble concentrating 2  0  Moving slowly or fidgety/restless 0  1  Suicidal thoughts 0  0  PHQ-9 Score 10  6  Difficult doing work/chores   Very difficult       08/17/2022   10:59 AM  GAD 7 : Generalized Anxiety Score  Nervous, Anxious, on Edge 2   Control/stop worrying 1  Worry too much - different things 1  Trouble relaxing 2  Restless 2  Easily annoyed or irritable 1  Afraid - awful might happen 0  Total GAD 7 Score 9      Upstream - 08/17/22 1108       Pregnancy Intention Screening   Does the patient want to become pregnant in the next year? No    Does the patient's partner want to become pregnant in the next year? No    Would the patient like to discuss contraceptive options today? No      Contraception Wrap Up   Current Method Female Sterilization    End Method Female Sterilization             Examination chaperoned by Malachy Mood LPN  Assessment:     1. Pregnancy examination or test, negative result - POCT urine pregnancy  2. Routine Papanicolaou smear Pap sent Pap in 3 years if normal - Cytology - PAP( [name])  3. Night sweats - TSH + free T4  4. Dysmenorrhea Will rx Micronor to see if helps with cramps   5. Decreased libido Try to increase frequency of sex   6. No appetite  - TSH + free  T4  7. Frequent headaches  - Comprehensive metabolic panel  8. Anxiety and depression Will rx lexapro 10 mg 1 daily  Meds ordered this encounter  Medications   norethindrone (MICRONOR) 0.35 MG tablet    Sig: Take 1 tablet (0.35 mg total) by mouth daily.    Dispense:  28 tablet    Refill:  11    Order Specific Question:   Supervising Provider    Answer:   Duane Lope H [[id number]]   escitalopram (LEXAPRO) 10 MG tablet    Sig: Take 1 tablet (10 mg total) by mouth daily.    Dispense:  30 tablet    Refill:  2    Order Specific Question:   Supervising Provider    Answer:   Despina Hidden, LUTHER H [[id number]]     9. Tired - CBC - Comprehensive metabolic panel  10. Screening cholesterol level - Lipid panel  11. Perimenopause Discussed a lot of her symptoms are perimenopausal Will try Micronor and lexapro first She is a sometime smoker  Review handout on perimenopause    Plan:    Note given to excuse  work 4/24, 4/25 and 4/26 return to work 08/21/22  Follow up in 8 weeks for ROS

## 2022-08-18 LAB — LIPID PANEL
Chol/HDL Ratio: 2.7 ratio (ref 0.0–4.4)
Cholesterol, Total: 205 mg/dL — ABNORMAL HIGH (ref 100–199)
HDL: 77 mg/dL (ref >39)
LDL Chol Calc (NIH): 102 mg/dL — ABNORMAL HIGH (ref 0–99)
Triglycerides: 152 mg/dL — ABNORMAL HIGH (ref 0–149)
VLDL Cholesterol Cal: 26 mg/dL (ref 5–40)

## 2022-08-18 LAB — COMPREHENSIVE METABOLIC PANEL
ALT: 15 IU/L (ref 0–32)
AST: 19 IU/L (ref 0–40)
Albumin/Globulin Ratio: 1.9 (ref 1.2–2.2)
Albumin: 5 g/dL — ABNORMAL HIGH (ref 3.9–4.9)
Alkaline Phosphatase: 67 IU/L (ref 44–121)
BUN/Creatinine Ratio: 15 (ref 9–23)
BUN: 10 mg/dL (ref 6–24)
Bilirubin Total: 0.3 mg/dL (ref 0.0–1.2)
CO2: 23 mmol/L (ref 20–29)
Calcium: 10.5 mg/dL — ABNORMAL HIGH (ref 8.7–10.2)
Chloride: 103 mmol/L (ref 96–106)
Creatinine, Ser: 0.68 mg/dL (ref 0.57–1.00)
Globulin, Total: 2.6 g/dL (ref 1.5–4.5)
Glucose: 99 mg/dL (ref 70–99)
Potassium: 4.7 mmol/L (ref 3.5–5.2)
Sodium: 141 mmol/L (ref 134–144)
Total Protein: 7.6 g/dL (ref 6.0–8.5)
eGFR: 108 mL/min/{1.73_m2} (ref >59)

## 2022-08-18 LAB — CBC
Hematocrit: 43.5 % (ref 34.0–46.6)
Hemoglobin: 14.3 g/dL (ref 11.1–15.9)
MCH: 29.7 pg (ref 26.6–33.0)
MCHC: 32.9 g/dL (ref 31.5–35.7)
MCV: 90 fL (ref 79–97)
Platelets: 310 10*3/uL (ref 150–450)
RBC: 4.81 x10E6/uL (ref 3.77–5.28)
RDW: 11.7 % (ref 11.7–15.4)
WBC: 6.4 10*3/uL (ref 3.4–10.8)

## 2022-08-18 LAB — TSH+FREE T4
Free T4: 1.01 ng/dL (ref 0.82–1.77)
TSH: 0.569 u[IU]/mL (ref 0.450–4.500)

## 2022-08-23 LAB — CYTOLOGY - PAP
Chlamydia: NEGATIVE
Comment: NEGATIVE
Comment: NEGATIVE
Comment: NORMAL
Diagnosis: NEGATIVE
Diagnosis: REACTIVE
High risk HPV: NEGATIVE
Neisseria Gonorrhea: NEGATIVE

## 2022-08-31 ENCOUNTER — Other Ambulatory Visit: Payer: Self-pay | Admitting: Adult Health

## 2022-10-12 ENCOUNTER — Ambulatory Visit: Payer: Medicaid Other | Admitting: Adult Health

## 2022-10-19 ENCOUNTER — Other Ambulatory Visit: Payer: Self-pay | Admitting: Adult Health

## 2022-10-19 MED ORDER — NORETHINDRONE 0.35 MG PO TABS: 1.0000 | ORAL_TABLET | Freq: Every day | ORAL | 11 refills | Status: DC

## 2022-10-19 NOTE — Progress Notes (Signed)
Refill for micronor sent

## 2022-11-20 ENCOUNTER — Other Ambulatory Visit: Payer: Self-pay | Admitting: Adult Health

## 2023-01-02 ENCOUNTER — Ambulatory Visit: Payer: Medicaid Other | Admitting: Family Medicine

## 2023-02-01 ENCOUNTER — Other Ambulatory Visit: Payer: Self-pay | Admitting: Adult Health

## 2023-02-01 MED ORDER — NORETHINDRONE 0.35 MG PO TABS: 1.0000 | ORAL_TABLET | Freq: Every day | ORAL | 11 refills | Status: DC

## 2023-02-01 NOTE — Progress Notes (Signed)
Refill sent in for micronor

## 2023-02-13 ENCOUNTER — Other Ambulatory Visit: Payer: Self-pay | Admitting: Adult Health

## 2023-03-09 ENCOUNTER — Other Ambulatory Visit: Payer: Self-pay | Admitting: Physical Medicine and Rehabilitation

## 2023-03-09 DIAGNOSIS — M533 Sacrococcygeal disorders, not elsewhere classified: Secondary | ICD-10-CM

## 2023-03-09 DIAGNOSIS — M545 Low back pain, unspecified: Secondary | ICD-10-CM

## 2023-07-06 ENCOUNTER — Other Ambulatory Visit (HOSPITAL_COMMUNITY): Payer: Self-pay | Admitting: Family Medicine

## 2023-07-06 DIAGNOSIS — Z1231 Encounter for screening mammogram for malignant neoplasm of breast: Secondary | ICD-10-CM

## 2023-07-11 ENCOUNTER — Encounter (HOSPITAL_COMMUNITY): Payer: Self-pay

## 2023-07-11 ENCOUNTER — Emergency Department (HOSPITAL_COMMUNITY)
Admission: EM | Admit: 2023-07-11 | Discharge: 2023-07-11 | Disposition: A | Discharge status: Home / Self Care | Attending: Emergency Medicine | Admitting: Emergency Medicine

## 2023-07-11 ENCOUNTER — Other Ambulatory Visit: Payer: Self-pay

## 2023-07-11 DIAGNOSIS — R509 Fever, unspecified: Secondary | ICD-10-CM | POA: Diagnosis present

## 2023-07-11 DIAGNOSIS — E860 Dehydration: Secondary | ICD-10-CM | POA: Diagnosis not present

## 2023-07-11 DIAGNOSIS — U071 COVID-19: Secondary | ICD-10-CM | POA: Insufficient documentation

## 2023-07-11 LAB — URINALYSIS, ROUTINE W REFLEX MICROSCOPIC
Bacteria, UA: NONE SEEN
Bilirubin Urine: NEGATIVE
Glucose, UA: NEGATIVE mg/dL
Hgb urine dipstick: NEGATIVE
Ketones, ur: NEGATIVE mg/dL
Leukocytes,Ua: NEGATIVE
Nitrite: NEGATIVE
Protein, ur: 30 mg/dL — AB
Specific Gravity, Urine: 1.025 (ref 1.005–1.030)
pH: 5 (ref 5.0–8.0)

## 2023-07-11 LAB — RESP PANEL BY RT-PCR (RSV, FLU A&B, COVID)  RVPGX2
Influenza A by PCR: NEGATIVE
Influenza B by PCR: NEGATIVE
Resp Syncytial Virus by PCR: NEGATIVE
SARS Coronavirus 2 by RT PCR: POSITIVE — AB

## 2023-07-11 LAB — COMPREHENSIVE METABOLIC PANEL
ALT: 14 U/L (ref 0–44)
AST: 19 U/L (ref 15–41)
Albumin: 4.6 g/dL (ref 3.5–5.0)
Alkaline Phosphatase: 57 U/L (ref 38–126)
Anion gap: 12 (ref 5–15)
BUN: 10 mg/dL (ref 6–20)
CO2: 25 mmol/L (ref 22–32)
Calcium: 9.4 mg/dL (ref 8.9–10.3)
Chloride: 101 mmol/L (ref 98–111)
Creatinine, Ser: 0.81 mg/dL (ref 0.44–1.00)
GFR, Estimated: >60 mL/min (ref >60)
Glucose, Bld: 77 mg/dL (ref 70–99)
Potassium: 3.9 mmol/L (ref 3.5–5.1)
Sodium: 138 mmol/L (ref 135–145)
Total Bilirubin: 0.5 mg/dL (ref 0.0–1.2)
Total Protein: 8 g/dL (ref 6.5–8.1)

## 2023-07-11 LAB — CBC WITH DIFFERENTIAL/PLATELET
Abs Immature Granulocytes: 0.01 10*3/uL (ref 0.00–0.07)
Basophils Absolute: 0.1 10*3/uL (ref 0.0–0.1)
Basophils Relative: 1 %
Eosinophils Absolute: 0.2 10*3/uL (ref 0.0–0.5)
Eosinophils Relative: 3 %
HCT: 41.1 % (ref 36.0–46.0)
Hemoglobin: 13.3 g/dL (ref 12.0–15.0)
Immature Granulocytes: 0 %
Lymphocytes Relative: 5 %
Lymphs Abs: 0.3 10*3/uL — ABNORMAL LOW (ref 0.7–4.0)
MCH: 30.4 pg (ref 26.0–34.0)
MCHC: 32.4 g/dL (ref 30.0–36.0)
MCV: 93.8 fL (ref 80.0–100.0)
Monocytes Absolute: 0.8 10*3/uL (ref 0.1–1.0)
Monocytes Relative: 13 %
Neutro Abs: 4.8 10*3/uL (ref 1.7–7.7)
Neutrophils Relative %: 78 %
Platelets: 244 10*3/uL (ref 150–400)
RBC: 4.38 MIL/uL (ref 3.87–5.11)
RDW: 12.8 % (ref 11.5–15.5)
WBC: 6.2 10*3/uL (ref 4.0–10.5)
nRBC: 0 % (ref 0.0–0.2)

## 2023-07-11 LAB — PREGNANCY, URINE: Preg Test, Ur: NEGATIVE

## 2023-07-11 MED ORDER — ONDANSETRON 4 MG PO TBDP: 4.0000 mg | ORAL_TABLET | Freq: Three times a day (TID) | ORAL | 0 refills | Status: DC | PRN

## 2023-07-11 MED ORDER — SODIUM CHLORIDE 0.9 % IV BOLUS
1000.0000 mL | Freq: Once | INTRAVENOUS | Status: AC
  Administered 2023-07-11: 1000 mL via INTRAVENOUS

## 2023-07-11 MED ORDER — KETOROLAC TROMETHAMINE 30 MG/ML IJ SOLN
30.0000 mg | Freq: Once | INTRAMUSCULAR | Status: AC
  Administered 2023-07-11: 30 mg via INTRAVENOUS
  Filled 2023-07-11: qty 1

## 2023-07-11 MED ORDER — ONDANSETRON HCL 4 MG/2ML IJ SOLN
INTRAMUSCULAR | Status: AC
  Administered 2023-07-11: 4 mg via INTRAVENOUS
  Filled 2023-07-11: qty 2

## 2023-07-11 MED ORDER — IBUPROFEN 600 MG PO TABS: 600.0000 mg | ORAL_TABLET | Freq: Four times a day (QID) | ORAL | 0 refills | Status: DC | PRN

## 2023-07-11 MED ORDER — ONDANSETRON HCL 4 MG/2ML IJ SOLN
4.0000 mg | Freq: Once | INTRAMUSCULAR | Status: AC
  Filled 2023-07-11: qty 2

## 2023-07-11 MED ORDER — HYDROCODONE-ACETAMINOPHEN 5-325 MG PO TABS: 1.0000 | ORAL_TABLET | ORAL | 0 refills | Status: DC | PRN

## 2023-07-11 MED ORDER — MORPHINE SULFATE (PF) 4 MG/ML IV SOLN
4.0000 mg | Freq: Once | INTRAVENOUS | Status: AC
  Administered 2023-07-11: 4 mg via INTRAVENOUS
  Filled 2023-07-11: qty 1

## 2023-07-11 NOTE — ED Provider Notes (Signed)
 [place] EMERGENCY DEPARTMENT AT [facility] Provider Note   CSN: [id number] Arrival date & time: 07/11/23  1050     History  Chief Complaint  Patient presents with   Fever    Mariah [name] is a 48 y.o. female.  Pt is a 48 yo female with pmhx significant for anxiety, and migraines.  Pt's husband has Covid.  Pt developed n/v and fever last night.  She has been unable to keep anything down.  Pt has pain all over.        Home Medications Prior to Admission medications   Medication Sig Start Date End Date Taking? Authorizing Provider  HYDROcodone-acetaminophen (NORCO/VICODIN) 5-325 MG tablet Take 1 tablet by mouth every 4 (four) hours as needed. 07/11/23  Yes Jacalyn Lefevre, MD  ibuprofen (ADVIL) 600 MG tablet Take 1 tablet (600 mg total) by mouth every 6 (six) hours as needed. 07/11/23  Yes Jacalyn Lefevre, MD  ondansetron (ZOFRAN-ODT) 4 MG disintegrating tablet Take 1 tablet (4 mg total) by mouth every 8 (eight) hours as needed. 07/11/23  Yes Jacalyn Lefevre, MD  escitalopram (LEXAPRO) 10 MG tablet TAKE ONE TABLET BY MOUTH ONCE DAILY. 02/13/23   Adline Potter, NP  norethindrone (MICRONOR) 0.35 MG tablet Take 1 tablet (0.35 mg total) by mouth daily. 02/01/23   Adline Potter, NP      Allergies    Penicillins    Review of Systems   Review of Systems  Respiratory:  Positive for cough.   Gastrointestinal:  Positive for nausea and vomiting.  All other systems reviewed and are negative.   Physical Exam Updated Vital Signs BP 129/75   Pulse 84   Temp 98.4 F (36.9 C) (Oral)   Resp 20   Ht 5\' 9"  (1.753 m)   Wt 64.3 kg   LMP 06/24/2023 (Approximate)   SpO2 96%   BMI 20.93 kg/m  Physical Exam Vitals and nursing note reviewed.  Constitutional:      Appearance: Normal appearance.  HENT:     Head: Normocephalic and atraumatic.     Right Ear: External ear normal.     Left Ear: External ear normal.     Nose: Nose normal.     Mouth/Throat:      Mouth: Mucous membranes are dry.  Eyes:     Extraocular Movements: Extraocular movements intact.     Conjunctiva/sclera: Conjunctivae normal.     Pupils: Pupils are equal, round, and reactive to light.  Cardiovascular:     Rate and Rhythm: Regular rhythm. Tachycardia present.     Pulses: Normal pulses.     Heart sounds: Normal heart sounds.  Pulmonary:     Effort: Pulmonary effort is normal.     Breath sounds: Normal breath sounds.  Abdominal:     General: Abdomen is flat. Bowel sounds are normal.     Palpations: Abdomen is soft.  Musculoskeletal:        General: Normal range of motion.     Cervical back: Normal range of motion and neck supple.  Skin:    General: Skin is warm.     Capillary Refill: Capillary refill takes less than 2 seconds.  Neurological:     General: No focal deficit present.     Mental Status: She is alert and oriented to person, place, and time.  Psychiatric:        Mood and Affect: Mood normal.        Behavior: Behavior normal.  ED Results / Procedures / Treatments   Labs (all labs ordered are listed, but only abnormal results are displayed) Labs Reviewed  RESP PANEL BY RT-PCR (RSV, FLU A&B, COVID)  RVPGX2 - Abnormal; Notable for the following components:      Result Value   SARS Coronavirus 2 by RT PCR POSITIVE (*)    All other components within normal limits  CBC WITH DIFFERENTIAL/PLATELET - Abnormal; Notable for the following components:   Lymphs Abs 0.3 (*)    All other components within normal limits  URINALYSIS, ROUTINE W REFLEX MICROSCOPIC - Abnormal; Notable for the following components:   APPearance HAZY (*)    Protein, ur 30 (*)    All other components within normal limits  COMPREHENSIVE METABOLIC PANEL  PREGNANCY, URINE    EKG None  Radiology No results found.  Procedures Procedures    Medications Ordered in ED Medications  sodium chloride 0.9 % bolus 1,000 mL (0 mLs Intravenous Stopped 07/11/23 1257)  ondansetron  (ZOFRAN) injection 4 mg (4 mg Intravenous Given 07/11/23 1133)  ketorolac (TORADOL) 30 MG/ML injection 30 mg (30 mg Intravenous Given 07/11/23 1137)  morphine (PF) 4 MG/ML injection 4 mg (4 mg Intravenous Given 07/11/23 1300)    ED Course/ Medical Decision Making/ A&P                                 Medical Decision Making Amount and/or Complexity of Data Reviewed Labs: ordered.  Risk Prescription drug management.   This patient presents to the ED for concern of myalgias and n/v, this involves an extensive number of treatment options, and is a complaint that carries with it a high risk of complications and morbidity.  The differential diagnosis includes covid/flu/rsv, electrolyte abn, uti   Co morbidities that complicate the patient evaluation  Anxiety and migraines   Additional history obtained:  Additional history obtained from epic chart review External records from outside source obtained and reviewed including husband   Lab Tests:  I Ordered, and personally interpreted labs.  The pertinent results include:  covid-19 +; ua neg for infection, preg neg, cmp neg, cbc nl   Medicines ordered and prescription drug management:  I ordered medication including ivfs/morphine/toradol  for sx  Reevaluation of the patient after these medicines showed that the patient improved I have reviewed the patients home medicines and have made adjustments as needed  Problem List / ED Course:  Covid-19:  pt is not hypoxic and is not at high risk for complications.  She declines Paxlovid. Dehydration:  pt is feeling better after ivfs/zofran.  She is tolerating po fluids.  She is stable for d/c.  Return if worse.  F/u with pcp.   Reevaluation:  After the interventions noted above, I reevaluated the patient and found that they have :improved   Social Determinants of Health:  Lives at home   Dispostion:  After consideration of the diagnostic results and the patients response to  treatment, I feel that the patent would benefit from discharge with outpatient f/u.          Final Clinical Impression(s) / ED Diagnoses Final diagnoses:  COVID-19  Dehydration    Rx / DC Orders ED Discharge Orders          Ordered    ibuprofen (ADVIL) 600 MG tablet  Every 6 hours PRN        07/11/23 1352    HYDROcodone-acetaminophen (NORCO/VICODIN) 5-325  MG tablet  Every 4 hours PRN        07/11/23 1352    ondansetron (ZOFRAN-ODT) 4 MG disintegrating tablet  Every 8 hours PRN        07/11/23 1352              Jacalyn Lefevre, MD 07/11/23 1354

## 2023-07-11 NOTE — ED Triage Notes (Signed)
 Pt arrived via POV c/o headache, N/V/D, fever of 103F since last night. Pt reports her spouse has recently tested positive for Covid.

## 2023-07-16 ENCOUNTER — Ambulatory Visit (HOSPITAL_COMMUNITY)

## 2023-07-16 ENCOUNTER — Ambulatory Visit: Payer: Self-pay | Admitting: Family Medicine

## 2023-07-16 NOTE — Telephone Encounter (Addendum)
  Chief Complaint: cough Symptoms: congestion, diarrhea  Disposition: [] ED /[] Urgent Care (no appt availability in office) / [x] Appointment(In office/virtual)/ []  [facility]/ [] Home Care/ [] Refused Recommended Disposition /[] [place] [facility]/ []  Follow-up with PCP Additional Notes: Pt complaining of new cough that started 2 days ago. Pt went to ED on  3/19 with n/v, fever. Pt tested + for covid. Pt has had diarrhea the last few times she has eaten food. Pt stated the coughing spells cause SOB, but no SOB any other time. Pt sounds very congested. Cough is productive with yellow mucus. Pt denies fever. Pt only wanted to see Dr Adriana Simas. Pt is asking if any cough medicine could be called in so she can rest at night.  Pt has appt 3/26 @ 0900. RN gave care advice and pt verbalized understanding.              Copied from CRM [id number]. Topic: Clinical - Red Word Triage >> Jul 16, 2023 10:18 AM Carlatta H wrote: [facility] that prompted transfer to Nurse Triage: Patient was diagnosed with Covid on Tuesday//Cough has gotten worse and she has constant Diarrhea for 3 days// Reason for Disposition  SEVERE coughing spells (e.g., whooping sound after coughing, vomiting after coughing)  Answer Assessment - Initial Assessment Questions 1. ONSET: "When did the cough begin?"      2 days ago 2. SEVERITY: "How bad is the cough today?"      Can make pt vomit 3. SPUTUM: "Describe the color of your sputum" (none, dry cough; clear, white, yellow, green)     Yellowish  4. HEMOPTYSIS: "Are you coughing up any blood?" If so ask: "How much?" (flecks, streaks, tablespoons, etc.)     Denies  5. DIFFICULTY BREATHING: "Are you having difficulty breathing?" If Yes, ask: "How bad is it?" (e.g., mild, moderate, severe)    - MILD: No SOB at rest, mild SOB with walking, speaks normally in sentences, can lie down, no retractions, pulse < 100.    - MODERATE: SOB at rest, SOB with minimal exertion and prefers  to sit, cannot lie down flat, speaks in phrases, mild retractions, audible wheezing, pulse 100-120.    - SEVERE: Very SOB at rest, speaks in single words, struggling to breathe, sitting hunched forward, retractions, pulse > 120      Mild  6. FEVER: "Do you have a fever?" If Yes, ask: "What is your temperature, how was it measured, and when did it start?"     Denies  7. CARDIAC HISTORY: "Do you have any history of heart disease?" (e.g., heart attack, congestive heart failure)      Denies  8. LUNG HISTORY: "Do you have any history of lung disease?"  (e.g., pulmonary embolus, asthma, emphysema)     Denies  9. PE RISK FACTORS: "Do you have a history of blood clots?" (or: recent major surgery, recent prolonged travel, bedridden)     Denies  10. OTHER SYMPTOMS: "Do you have any other symptoms?" (e.g., runny nose, wheezing, chest pain)       Sinus pressure  Protocols used: Cough - Acute Productive-A-AH

## 2023-07-17 ENCOUNTER — Inpatient Hospital Stay: Admitting: Family Medicine

## 2023-07-18 ENCOUNTER — Encounter: Payer: Self-pay | Admitting: Family Medicine

## 2023-08-31 ENCOUNTER — Emergency Department (HOSPITAL_COMMUNITY)

## 2023-08-31 ENCOUNTER — Encounter (HOSPITAL_COMMUNITY): Payer: Self-pay | Admitting: *Deleted

## 2023-08-31 ENCOUNTER — Emergency Department (HOSPITAL_COMMUNITY)
Admission: EM | Admit: 2023-08-31 | Discharge: 2023-08-31 | Disposition: A | Discharge status: Home / Self Care | Attending: Emergency Medicine | Admitting: Emergency Medicine

## 2023-08-31 ENCOUNTER — Other Ambulatory Visit: Payer: Self-pay

## 2023-08-31 DIAGNOSIS — R42 Dizziness and giddiness: Secondary | ICD-10-CM | POA: Insufficient documentation

## 2023-08-31 DIAGNOSIS — R519 Headache, unspecified: Secondary | ICD-10-CM | POA: Diagnosis not present

## 2023-08-31 DIAGNOSIS — M542 Cervicalgia: Secondary | ICD-10-CM | POA: Insufficient documentation

## 2023-08-31 LAB — PREGNANCY, URINE: Preg Test, Ur: NEGATIVE

## 2023-08-31 LAB — COMPREHENSIVE METABOLIC PANEL WITH GFR
ALT: 16 U/L (ref 0–44)
AST: 18 U/L (ref 15–41)
Albumin: 4.4 g/dL (ref 3.5–5.0)
Alkaline Phosphatase: 58 U/L (ref 38–126)
Anion gap: 10 (ref 5–15)
BUN: 11 mg/dL (ref 6–20)
CO2: 26 mmol/L (ref 22–32)
Calcium: 9.9 mg/dL (ref 8.9–10.3)
Chloride: 100 mmol/L (ref 98–111)
Creatinine, Ser: 0.68 mg/dL (ref 0.44–1.00)
GFR, Estimated: >60 mL/min (ref >60)
Glucose, Bld: 108 mg/dL — ABNORMAL HIGH (ref 70–99)
Potassium: 4.3 mmol/L (ref 3.5–5.1)
Sodium: 136 mmol/L (ref 135–145)
Total Bilirubin: 0.8 mg/dL (ref 0.0–1.2)
Total Protein: 7.7 g/dL (ref 6.5–8.1)

## 2023-08-31 LAB — ETHANOL: Alcohol, Ethyl (B): <15 mg/dL (ref <15)

## 2023-08-31 LAB — CBC WITH DIFFERENTIAL/PLATELET
Abs Immature Granulocytes: 0.01 10*3/uL (ref 0.00–0.07)
Basophils Absolute: 0.1 10*3/uL (ref 0.0–0.1)
Basophils Relative: 1 %
Eosinophils Absolute: 0.3 10*3/uL (ref 0.0–0.5)
Eosinophils Relative: 6 %
HCT: 42.3 % (ref 36.0–46.0)
Hemoglobin: 13.8 g/dL (ref 12.0–15.0)
Immature Granulocytes: 0 %
Lymphocytes Relative: 24 %
Lymphs Abs: 1.4 10*3/uL (ref 0.7–4.0)
MCH: 30.8 pg (ref 26.0–34.0)
MCHC: 32.6 g/dL (ref 30.0–36.0)
MCV: 94.4 fL (ref 80.0–100.0)
Monocytes Absolute: 0.4 10*3/uL (ref 0.1–1.0)
Monocytes Relative: 7 %
Neutro Abs: 3.6 10*3/uL (ref 1.7–7.7)
Neutrophils Relative %: 62 %
Platelets: 286 10*3/uL (ref 150–400)
RBC: 4.48 MIL/uL (ref 3.87–5.11)
RDW: 12.5 % (ref 11.5–15.5)
WBC: 5.8 10*3/uL (ref 4.0–10.5)
nRBC: 0 % (ref 0.0–0.2)

## 2023-08-31 LAB — URINALYSIS, ROUTINE W REFLEX MICROSCOPIC
Bilirubin Urine: NEGATIVE
Glucose, UA: NEGATIVE mg/dL
Ketones, ur: NEGATIVE mg/dL
Leukocytes,Ua: NEGATIVE
Nitrite: NEGATIVE
Protein, ur: NEGATIVE mg/dL
Specific Gravity, Urine: 1.021 (ref 1.005–1.030)
pH: 6 (ref 5.0–8.0)

## 2023-08-31 MED ORDER — SODIUM CHLORIDE 0.9 % IV BOLUS
1000.0000 mL | Freq: Once | INTRAVENOUS | Status: AC
  Administered 2023-08-31: 1000 mL via INTRAVENOUS

## 2023-08-31 MED ORDER — PROCHLORPERAZINE EDISYLATE 10 MG/2ML IJ SOLN
10.0000 mg | Freq: Once | INTRAMUSCULAR | Status: AC
  Administered 2023-08-31: 10 mg via INTRAVENOUS
  Filled 2023-08-31: qty 2

## 2023-08-31 MED ORDER — DEXAMETHASONE SODIUM PHOSPHATE 10 MG/ML IJ SOLN
10.0000 mg | Freq: Once | INTRAMUSCULAR | Status: AC
  Administered 2023-08-31: 10 mg via INTRAVENOUS
  Filled 2023-08-31: qty 1

## 2023-08-31 MED ORDER — MECLIZINE HCL 12.5 MG PO TABS: 25.0000 mg | ORAL_TABLET | Freq: Three times a day (TID) | ORAL | 0 refills | Status: AC

## 2023-08-31 MED ORDER — KETOROLAC TROMETHAMINE 30 MG/ML IJ SOLN
30.0000 mg | Freq: Once | INTRAMUSCULAR | Status: AC
  Administered 2023-08-31: 30 mg via INTRAVENOUS
  Filled 2023-08-31: qty 1

## 2023-08-31 MED ORDER — METOCLOPRAMIDE HCL 5 MG/ML IJ SOLN
10.0000 mg | Freq: Once | INTRAMUSCULAR | Status: AC
  Administered 2023-08-31: 10 mg via INTRAVENOUS
  Filled 2023-08-31: qty 2

## 2023-08-31 MED ORDER — IOHEXOL 350 MG/ML SOLN
75.0000 mL | Freq: Once | INTRAVENOUS | Status: AC | PRN
Start: 2023-08-31 — End: 2023-08-31
  Administered 2023-08-31: 75 mL via INTRAVENOUS

## 2023-08-31 NOTE — Discharge Instructions (Signed)
 As discussed, your evaluation today has been largely reassuring.  But, it is important that you monitor your condition carefully, and do not hesitate to return to the ED if you develop new, or concerning changes in your condition.  For the next 3 days please take meclizine , 25 mg, 3 times daily.  You may then resume taking it as previously prescribed.  Otherwise, please follow-up with your physician for appropriate ongoing care.

## 2023-08-31 NOTE — ED Provider Notes (Signed)
  EMERGENCY DEPARTMENT AT [facility] Provider Note   CSN: [id number] Arrival date & time: 08/31/23  1034     History  Chief Complaint  Patient presents with   Dizziness    Mariah [name] is a 48 y.o. female.  HPI Patient presents with dizziness, headache, neck pain.  She does have a history of vertigo, notes that this is different from the usual.  Worse typically with vertigo she improves with meclizine .  For 3 days she has taken this medication without relief.  Pain is severe, right posterior head, neck, unrelenting.  No chest pain, no syncope, no vision changes.    Home Medications Prior to Admission medications   Medication Sig Start Date End Date Taking? Authorizing Provider  HYDROcodone -acetaminophen  (NORCO/VICODIN) 5-325 MG tablet Take 1 tablet by mouth every 4 (four) hours as needed. Patient taking differently: Take 1 tablet by mouth every 4 (four) hours as needed for moderate pain (pain score 4-6). 07/11/23  Yes Sueellen Emery, MD  meclizine  (ANTIVERT ) 12.5 MG tablet Take 2 tablets (25 mg total) by mouth 3 (three) times daily for 3 days. 08/31/23 09/03/23  Dorenda Gandy, MD      Allergies    Penicillins    Review of Systems   Review of Systems  Physical Exam Updated Vital Signs BP (!) 156/99   Pulse 78   Temp 98 F (36.7 C) (Oral)   Resp 20   Ht 5\' 9"  (1.753 m)   Wt 68 kg   LMP 08/25/2023   SpO2 99%   BMI 22.15 kg/m  Physical Exam Vitals and nursing note reviewed.  Constitutional:      General: She is not in acute distress.    Appearance: She is well-developed.  HENT:     Head: Normocephalic and atraumatic.  Eyes:     Conjunctiva/sclera: Conjunctivae normal.  Cardiovascular:     Rate and Rhythm: Normal rate and regular rhythm.  Pulmonary:     Effort: Pulmonary effort is normal. No respiratory distress.     Breath sounds: Normal breath sounds. No stridor.  Abdominal:     General: There is no distension.  Skin:    General:  Skin is warm and dry.  Neurological:     General: No focal deficit present.     Mental Status: She is alert and oriented to person, place, and time.     Cranial Nerves: No cranial nerve deficit.     Motor: No weakness.     Coordination: Coordination normal.  Psychiatric:        Mood and Affect: Mood normal.     ED Results / Procedures / Treatments   Labs (all labs ordered are listed, but only abnormal results are displayed) Labs Reviewed  COMPREHENSIVE METABOLIC PANEL WITH GFR - Abnormal; Notable for the following components:      Result Value   Glucose, Bld 108 (*)    All other components within normal limits  URINALYSIS, ROUTINE W REFLEX MICROSCOPIC - Abnormal; Notable for the following components:   APPearance CLOUDY (*)    Hgb urine dipstick SMALL (*)    Bacteria, UA FEW (*)    All other components within normal limits  ETHANOL  CBC WITH DIFFERENTIAL/PLATELET  PREGNANCY, URINE    EKG None  Radiology CT Angio Head Neck W WO CM Result Date: 08/31/2023 CLINICAL DATA:  Vertigo, headache, and right-sided neck pain. EXAM: CT ANGIOGRAPHY HEAD AND NECK WITH AND WITHOUT CONTRAST TECHNIQUE: Multidetector CT imaging of the  head and neck was performed using the standard protocol during bolus administration of intravenous contrast. Multiplanar CT image reconstructions and MIPs were obtained to evaluate the vascular anatomy. Carotid stenosis measurements (when applicable) are obtained utilizing NASCET criteria, using the distal internal carotid diameter as the denominator. RADIATION DOSE REDUCTION: This exam was performed according to the departmental dose-optimization program which includes automated exposure control, adjustment of the mA and/or kV according to patient size and/or use of iterative reconstruction technique. CONTRAST:  75mL OMNIPAQUE IOHEXOL 350 MG/ML SOLN COMPARISON:  Head CT 05/04/2012 FINDINGS: CT HEAD FINDINGS Brain: There is no evidence of an acute infarct, intracranial  hemorrhage, mass, midline shift, or extra-axial fluid collection. Cerebral volume is normal. The ventricles are normal in size. Vascular: No hyperdense vessel. Skull: No fracture or suspicious lesion. Sinuses/Orbits: Mild mucosal thickening in the paranasal sinuses. Clear mastoid air cells. Unremarkable orbits. Other: None. Review of the MIP images confirms the above findings CTA NECK FINDINGS Aortic arch: Standard branching with widely patent arch vessel origins. Right carotid system: Patent and smooth without evidence of stenosis or dissection. Left carotid system: Patent and smooth without evidence of stenosis or dissection. Vertebral arteries: Patent and strongly dominant right vertebral artery without evidence of stenosis or dissection. Diffusely hypoplastic but patent left vertebral artery without evidence of a significant focal stenosis, although streak artifact obscures the proximal left V1 segment. Skeleton: Cervical spondylosis most notable at C5-6. Moderate facet arthrosis at C3-4 and C4-5 with at least moderate left-sided neural foraminal stenosis at these levels. Other neck: No evidence of cervical lymphadenopathy or mass. Upper chest: Clear lung apices. Review of the MIP images confirms the above findings CTA HEAD FINDINGS Anterior circulation: The internal carotid arteries are widely patent from skull base to carotid termini. ACAs and MCAs are patent without evidence of a proximal branch occlusion or significant proximal stenosis. No aneurysm is identified. Posterior circulation: The intracranial vertebral arteries are patent to the basilar. Patent bilateral PICA, right AICA, and bilateral SCA origins are visualized. The basilar artery is widely patent. There are small left and likely diminutive right posterior communicating arteries. Both PCAs are patent without evidence of a significant proximal stenosis. No aneurysm is identified. Venous sinuses: Patent. Anatomic variants: Strongly dominant right  vertebral artery. Review of the MIP images confirms the above findings IMPRESSION: 1. Negative CTA of the head and neck. 2. Negative noncontrast CT appearance of the brain. Electronically Signed   By: Aundra Lee M.D.   On: 08/31/2023 16:36    Procedures Procedures    Medications Ordered in ED Medications  sodium chloride  0.9 % bolus 1,000 mL (0 mLs Intravenous Stopped 08/31/23 1516)  ketorolac  (TORADOL ) 30 MG/ML injection 30 mg (30 mg Intravenous Given 08/31/23 1355)  prochlorperazine (COMPAZINE) injection 10 mg (10 mg Intravenous Given 08/31/23 1355)  iohexol (OMNIPAQUE) 350 MG/ML injection 75 mL (75 mLs Intravenous Contrast Given 08/31/23 1517)  dexamethasone  (DECADRON ) injection 10 mg (10 mg Intravenous Given 08/31/23 1556)  metoCLOPramide (REGLAN) injection 10 mg (10 mg Intravenous Given 08/31/23 1556)    ED Course/ Medical Decision Making/ A&P                                 Medical Decision Making Adult female with history of vertigo, migraines presents with atypical dizziness as well as new pain in her head, neck.  Broad differential including mass, vascular disruption, less likely stroke, atypical migraine. Patient received fluids and  meds CT labs.  Amount and/or Complexity of Data Reviewed Independent Historian:     Details: Female companion at bedside Labs: ordered. Decision-making details documented in ED Course. Radiology: ordered and independent interpretation performed. Decision-making details documented in ED Course.  Risk Prescription drug management.  Pulse ox 99% room air normal cardiac 80 sinus normal 4:59 PM Feeling better, sitting upright, speaking clearly.  I reviewed the patient's labs, CT imaging, no evidence for vascular occlusion, nor stroke, or mass.  No specific electrolyte abnormalities, no evidence for infection, urinalysis unremarkable. Given her improvement here suspicion for atypical migraine versus vertigo, with reassuring findings as above, patient will be  discharged to follow-up with her clinical team.        Final Clinical Impression(s) / ED Diagnoses Final diagnoses:  Dizziness  Neck pain    Rx / DC Orders ED Discharge Orders          Ordered    meclizine  (ANTIVERT ) 12.5 MG tablet  3 times daily        08/31/23 1659              Dorenda Gandy, MD 08/31/23 1659

## 2023-08-31 NOTE — ED Triage Notes (Signed)
 Pt with c/o dizziness x 3 days, hx of vertigo. Pt states this is different than her usual vertigo. Pt has tried meclizine  without any relief. Pt also c/o stiff neck x 2 days. Chills last night but denies fever. Migraine HA a week ago.

## 2023-09-04 ENCOUNTER — Ambulatory Visit: Payer: Self-pay

## 2023-09-04 NOTE — Telephone Encounter (Signed)
 Copied from CRM [id number]. Topic: Clinical - Red Word Triage >> Sep 04, 2023  1:30 PM DeAngela L wrote: Red Word that prompted transfer to Nurse Triage: Patient went to hospital on 08/31/23 Patient was experiencing vertigo concerns and still dizzy and also her blood pressure was up when she went to hospital and she believes her blood pressure is still high now and still really dizzy after being prescribed meclizine  medication Pt was calling to schedule hospital f/u but still having concerns   Chief Complaint: Dizziness  Symptoms: Lightheadedness  Frequency: Constant  Pertinent Negatives: Patient denies neck pain, headache, vertigo  Disposition: [] ED /[] Urgent Care (no appt availability in office) / [x] Appointment(In office/virtual)/ []  [facility]/ [] Home Care/ [] Refused Recommended Disposition /[] [facility]/ []  Follow-up with PCP Additional Notes: Patient reports she has been experiencing dizziness for the last week. She states she was seen in the ED 4 days ago and that her blood pressure was high at that time as well. She states that she has a history of vertigo and was prescribed Meclizine  which she has been taking without relief. Patient denies any other symptom with her dizziness at this time. Follow-up appointment made for the patient on Thursday with Dr. Debrah Fan.    Reason for Disposition  [1] MODERATE dizziness (e.g., interferes with normal activities) AND [2] has been evaluated by doctor (or NP/PA) for this  Answer Assessment - Initial Assessment Questions 1. DESCRIPTION: "Describe your dizziness."     Feeling lightheaded  2. LIGHTHEADED: "Do you feel lightheaded?" (e.g., somewhat faint, woozy, weak upon standing)     Yes 3. VERTIGO: "Do you feel like either you or the room is spinning or tilting?" (i.e. vertigo)     No 4. SEVERITY: "How bad is it?"  "Do you feel like you are going to faint?" "Can you stand and walk?"   - MILD: Feels slightly dizzy, but walking  normally.   - MODERATE: Feels unsteady when walking, but not falling; interferes with normal activities (e.g., school, work).   - SEVERE: Unable to walk without falling, or requires assistance to walk without falling; feels like passing out now.      Moderate  5. ONSET:  "When did the dizziness begin?"     6 days ago  6. AGGRAVATING FACTORS: "Does anything make it worse?" (e.g., standing, change in head position)     Standing up worsens dizziness  7. HEART RATE: "Can you tell me your heart rate?" "How many beats in 15 seconds?"  (Note: not all patients can do this)       N/A 8. CAUSE: "What do you think is causing the dizziness?"     History of vertigo, recent high blood pressures  9. RECURRENT SYMPTOM: "Have you had dizziness before?" If Yes, ask: "When was the last time?" "What happened that time?"     Yes, history of vertigo  10. OTHER SYMPTOMS: "Do you have any other symptoms?" (e.g., fever, chest pain, vomiting, diarrhea, bleeding)       No  Protocols used: Dizziness - Lightheadedness-A-AH

## 2023-09-06 ENCOUNTER — Inpatient Hospital Stay: Admitting: Family Medicine

## 2023-09-07 ENCOUNTER — Encounter: Payer: Self-pay | Admitting: Family Medicine

## 2023-09-07 ENCOUNTER — Ambulatory Visit: Admitting: Family Medicine

## 2023-09-07 VITALS — BP 126/80 | HR 97 | Temp 98.6°F | Ht 69.0 in | Wt 162.0 lb

## 2023-09-07 DIAGNOSIS — R42 Dizziness and giddiness: Secondary | ICD-10-CM

## 2023-09-07 DIAGNOSIS — G894 Chronic pain syndrome: Secondary | ICD-10-CM

## 2023-09-07 DIAGNOSIS — F419 Anxiety disorder, unspecified: Secondary | ICD-10-CM | POA: Diagnosis not present

## 2023-09-07 DIAGNOSIS — R21 Rash and other nonspecific skin eruption: Secondary | ICD-10-CM | POA: Diagnosis not present

## 2023-09-07 DIAGNOSIS — F32A Depression, unspecified: Secondary | ICD-10-CM | POA: Diagnosis not present

## 2023-09-07 MED ORDER — DULOXETINE HCL 60 MG PO CPEP: 60.0000 mg | ORAL_CAPSULE | Freq: Every day | ORAL | 1 refills | Status: DC

## 2023-09-07 MED ORDER — DOXYCYCLINE HYCLATE 100 MG PO TABS
100.0000 mg | ORAL_TABLET | Freq: Two times a day (BID) | ORAL | 0 refills | Status: DC
Start: 2023-09-07 — End: 2024-02-06

## 2023-09-07 NOTE — Patient Instructions (Signed)
 Medications as prescribed.  If rash persist, will refer to dermatology.

## 2023-09-09 DIAGNOSIS — R21 Rash and other nonspecific skin eruption: Secondary | ICD-10-CM | POA: Insufficient documentation

## 2023-09-09 DIAGNOSIS — R42 Dizziness and giddiness: Secondary | ICD-10-CM | POA: Insufficient documentation

## 2023-09-09 NOTE — Assessment & Plan Note (Signed)
Starting Cymbalta 

## 2023-09-09 NOTE — Assessment & Plan Note (Signed)
 Vertigo improved

## 2023-09-09 NOTE — Assessment & Plan Note (Signed)
 Uncontrolled/worsening. Starting Cymbalta .

## 2023-09-09 NOTE — Assessment & Plan Note (Signed)
 Trial of Doxy.

## 2023-09-09 NOTE — Progress Notes (Signed)
 Subjective:  Patient ID: Mariah [name], female    DOB: 1976/01/18  Age: 48 y.o. MRN: [id number]  CC:   Chief Complaint  Patient presents with   Follow-up    ED follow up for dizziness  Symptoms have improved.    Anxiety    HPI:  48 year old female presents for evaluation of the above.  Recent dizziness consistent with vertigo. Currently improved but still having lightheadedness.   Reports rash on her face. Dry and red. No known inciting factors.  Additionally, patient continues to have chronic low back pain. Anxiety and depression uncontrolled as well (PHQ 9 = 12; GAD 7 = 13).  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    Social Hx   Social History   Socioeconomic History   Marital status: Married    Spouse name: Not on file   Number of children: Not on file   Years of education: Not on file   Highest education level: Not on file  Occupational History   Not on file  Tobacco Use   Smoking status: Some Days    Current packs/day: 0.00    Types: Cigarettes    Last attempt to quit: 05/27/2014    Years since quitting: 9.2   Smokeless tobacco: Never  Vaping Use   Vaping status: Never Used  Substance and Sexual Activity   Alcohol use: Yes    Comment: occ   Drug use: No   Sexual activity: Yes    Birth control/protection: Surgical    Comment: tubal  Other Topics Concern   Not on file  Social History Narrative   Not on file   Social Drivers of Health   Financial Resource Strain: Low Risk  (08/17/2022)   Overall Financial Resource Strain (CARDIA)    Difficulty of Paying Living Expenses: Not hard at all  Food Insecurity: No  Food Insecurity (08/17/2022)   Hunger Vital Sign    Worried About Running Out of Food in the Last Year: Never true    Ran Out of Food in the Last Year: Never true  Transportation Needs: No Transportation Needs (08/17/2022)   PRAPARE - Administrator, Civil Service (Medical): No    Lack of Transportation (Non-Medical): No  Physical Activity: Sufficiently Active (08/17/2022)   Exercise Vital Sign    Days of Exercise per Week: 4 days    Minutes of Exercise per Session: 60 min  Stress: Stress Concern Present (08/17/2022)   [employer] of Occupational Health - Occupational Stress Questionnaire    Feeling of Stress : Rather much  Social Connections: Moderately Isolated (08/17/2022)   Social Connection and Isolation Panel [NHANES]    Frequency of Communication with Friends and Family: Once a week    Frequency of Social Gatherings with Friends and Family: Never    Attends Religious Services: More than 4 times per year    Active Member of [employer] or Organizations: No    Attends Banker Meetings: Never    Marital Status: Married    Review of Systems Per HPI  Objective:  BP 126/80   Pulse 97   Temp 98.6 F (37 C)  Ht 5\' 9"  (1.753 m)   Wt 162 lb (73.5 kg)   LMP 08/25/2023   SpO2 98%   BMI 23.92 kg/m      09/07/2023   10:28 AM 08/31/2023    3:00 PM 08/31/2023    2:00 PM  BP/Weight  Systolic BP 126 156 151  Diastolic BP 80 99 99  Wt. (Lbs) 162    BMI 23.92 kg/m2      Physical Exam Vitals and nursing note reviewed.  Constitutional:      General: She is not in acute distress.    Appearance: Normal appearance.  HENT:     Head: Normocephalic and atraumatic.  Eyes:     General:        Right eye: No discharge.        Left eye: No discharge.     Conjunctiva/sclera: Conjunctivae normal.  Cardiovascular:     Rate and Rhythm: Normal rate and regular rhythm.  Pulmonary:     Effort: Pulmonary effort is normal.     Breath sounds: Normal breath sounds.   Skin:    Comments: Face with erythematous papules.  Neurological:     Mental Status: She is alert.     Lab Results  Component Value Date   WBC 5.8 08/31/2023   HGB 13.8 08/31/2023   HCT 42.3 08/31/2023   PLT 286 08/31/2023   GLUCOSE 108 (H) 08/31/2023   CHOL 205 (H) 08/17/2022   TRIG 152 (H) 08/17/2022   HDL 77 08/17/2022   LDLCALC 102 (H) 08/17/2022   ALT 16 08/31/2023   AST 18 08/31/2023   NA 136 08/31/2023   K 4.3 08/31/2023   CL 100 08/31/2023   CREATININE 0.68 08/31/2023   BUN 11 08/31/2023   CO2 26 08/31/2023   TSH 0.569 08/17/2022     Assessment & Plan:  Anxiety and depression Assessment & Plan: Uncontrolled/worsening. Starting Cymbalta .   Orders: -     DULoxetine  HCl; Take 1 capsule (60 mg total) by mouth daily.  Dispense: 90 capsule; Refill: 1  Chronic pain syndrome Assessment & Plan: Starting Cymbalta .  Orders: -     DULoxetine  HCl; Take 1 capsule (60 mg total) by mouth daily.  Dispense: 90 capsule; Refill: 1  Dizziness Assessment & Plan: Vertigo improved.   Rash Assessment & Plan: Trial of Doxy.  Orders: -     Doxycycline  Hyclate; Take 1 tablet (100 mg total) by mouth 2 (two) times daily. Take with food and water.  Dispense: 60 tablet; Refill: 0    Maisha Bogen DO [facility]

## 2024-02-05 ENCOUNTER — Telehealth: Payer: Self-pay | Admitting: Family Medicine

## 2024-02-05 ENCOUNTER — Ambulatory Visit: Payer: Self-pay | Admitting: *Deleted

## 2024-02-05 NOTE — Telephone Encounter (Signed)
  FYI Only or Action Required?: Action required by provider: request for appointment, medication refill request, and update on patient condition.  Patient was last seen in primary care on 09/07/2023 by Cook, Jayce G, DO.  Called Nurse Triage reporting Dizziness.  Symptoms began yesterday.  Interventions attempted: Rest, hydration, or home remedies.  Symptoms are: gradually worsening.  Triage Disposition: See Physician Within 24 Hours  Patient/caregiver understands and will follow disposition?: Yes                   Copied from CRM #[id number]. Topic: Clinical - Red Word Triage >> Feb 05, 2024 12:02 PM Amber H wrote: [facility] that prompted transfer to Nurse Triage: Patient stated she is experiencing vertigo , dizziness and is very light headed. Reason for Disposition  [1] MODERATE dizziness (e.g., vertigo; feels very unsteady, interferes with normal activities) AND [2] has NOT been evaluated by doctor (or NP/PA) for this  Answer Assessment - Initial Assessment Questions Appt scheduled tomorrow with PCP. Patient requesting if medication meclizine  can be refilled. Medication not on current med list. Reports she does have hx of vertigo.      1. DESCRIPTION: Describe your dizziness.     Lightheaded and room spinning  2. VERTIGO: Do you feel like either you or the room is spinning or tilting?      Room spinning  3. LIGHTHEADED: Do you feel lightheaded? (e.g., somewhat faint, woozy, weak upon standing)     Can't steady self . 4. SEVERITY: How bad is it?  Can you walk?     Severe but can walk now  5. ONSET:  When did the dizziness begin?     Yesterday  6. AGGRAVATING FACTORS: Does anything make it worse? (e.g., standing, change in head position)     Change in head position  yesterday . Can move head today  7. CAUSE: What do you think is causing the dizziness?     Vertigo  8. RECURRENT SYMPTOM: Have you had dizziness before? If Yes, ask: When was the  last time? What happened that time?     Yes  9. OTHER SYMPTOMS: Do you have any other symptoms? (e.g., earache, headache, numbness, tinnitus, vomiting, weakness)     Yesterday vomiting but none today.  Can walk without assist today decrease in vertigo room spinning but still does not feel well. No headaches, no weakness on either side of body no fever no N/T. No earache.  10. PREGNANCY: Is there any chance you are pregnant? When was your last menstrual period?       na  Protocols used: Dizziness - Vertigo-A-AH

## 2024-02-05 NOTE — Telephone Encounter (Unsigned)
 Copied from CRM [id number]. Topic: Clinical - Medication Refill >> Feb 05, 2024 11:56 AM Amber H wrote: Medication: meclizine  (ANTIVERT ) 12.5 MG tablet  Has the patient contacted their pharmacy? No, due to refills  (Agent: If no, request that the patient contact the pharmacy for the refill. If patient does not wish to contact the pharmacy document the reason why and proceed with request.) (Agent: If yes, when and what did the pharmacy advise?)  This is the patient's preferred pharmacy:  [facility] - [place], [place] - [address] [place] [address] Phone: [phone] Fax: [phone]  Is this the correct pharmacy for this prescription? Yes If no, delete pharmacy and type the correct one.   Has the prescription been filled recently? No, last refill showed 08/2023  Is the patient out of the medication? Yes  Has the patient been seen for an appointment in the last year OR does the patient have an upcoming appointment? Yes  Can we respond through MyChart? Yes  Agent: Please be advised that Rx refills may take up to 3 business days. We ask that you follow-up with your pharmacy.

## 2024-02-05 NOTE — Telephone Encounter (Signed)
 Duplicate- see other message- appointment scheduled

## 2024-02-05 NOTE — Telephone Encounter (Signed)
 Requested medication is not on current medication list. Please see recent NT encounter regarding symptoms.

## 2024-02-06 ENCOUNTER — Ambulatory Visit (INDEPENDENT_AMBULATORY_CARE_PROVIDER_SITE_OTHER): Payer: Self-pay | Admitting: Family Medicine

## 2024-02-06 ENCOUNTER — Encounter: Payer: Self-pay | Admitting: Family Medicine

## 2024-02-06 VITALS — BP 133/85 | HR 119 | Resp 18 | Ht 69.0 in | Wt 158.1 lb

## 2024-02-06 DIAGNOSIS — R42 Dizziness and giddiness: Secondary | ICD-10-CM | POA: Diagnosis not present

## 2024-02-06 DIAGNOSIS — M545 Low back pain, unspecified: Secondary | ICD-10-CM | POA: Diagnosis not present

## 2024-02-06 DIAGNOSIS — G8929 Other chronic pain: Secondary | ICD-10-CM | POA: Diagnosis not present

## 2024-02-06 MED ORDER — ONDANSETRON 4 MG PO TBDP: 4.0000 mg | ORAL_TABLET | Freq: Three times a day (TID) | ORAL | 0 refills | Status: AC | PRN

## 2024-02-06 MED ORDER — MECLIZINE HCL 25 MG PO TABS
25.0000 mg | ORAL_TABLET | Freq: Three times a day (TID) | ORAL | 0 refills | Status: AC | PRN
Start: 2024-02-06 — End: ?

## 2024-02-06 MED ORDER — HYDROCODONE-ACETAMINOPHEN 5-325 MG PO TABS: 1.0000 | ORAL_TABLET | Freq: Three times a day (TID) | ORAL | 0 refills | Status: AC | PRN

## 2024-02-06 NOTE — Patient Instructions (Signed)
Medications as prescribed. ° °Take care ° °Dr. Allon Costlow  °

## 2024-02-07 DIAGNOSIS — R42 Dizziness and giddiness: Secondary | ICD-10-CM | POA: Insufficient documentation

## 2024-02-07 NOTE — Progress Notes (Signed)
 Subjective:  Patient ID: Mariah [name], female    DOB: 12/02/1975  Age: 48 y.o. MRN: [id number]  CC:   Chief Complaint  Patient presents with   Dizziness    Pt reports she had severe vertigo with nausea and vomiting on Monday, has improved but still dizzy and lightheaded.     HPI:  48 year old female presents for evaluation of the above.  Patient reports that on Monday morning she developed vertigo with associated nausea and vomiting.  Symptoms have not improved but she still feels lightheaded.  Needs note for work.  Also, patient is having worsening low back pain. Has known chronic pain. Having difficulty with activities due to back pain. Requesting refill of prior pain medication.  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    Social Hx   Social History   Socioeconomic History   Marital status: Married    Spouse name: Not on file   Number of children: Not on file   Years of education: Not on file   Highest education level: Not on file  Occupational History   Not on file  Tobacco Use   Smoking status: Some Days    Current packs/day: 0.00    Types: Cigarettes    Last attempt to quit: 05/27/2014    Years since quitting: 9.7   Smokeless tobacco: Never  Vaping Use   Vaping status: Never Used  Substance and Sexual Activity   Alcohol use: Yes    Comment: occ   Drug use: No   Sexual activity: Yes    Birth control/protection: Surgical    Comment: tubal  Other Topics Concern   Not on file  Social History Narrative   Not on file   Social Drivers of Health   Financial Resource Strain: Low Risk  (08/17/2022)   Overall Financial Resource Strain (CARDIA)     Difficulty of Paying Living Expenses: Not hard at all  Food Insecurity: No Food Insecurity (08/17/2022)   Hunger Vital Sign    Worried About Running Out of Food in the Last Year: Never true    Ran Out of Food in the Last Year: Never true  Transportation Needs: No Transportation Needs (08/17/2022)   PRAPARE - Administrator, Civil Service (Medical): No    Lack of Transportation (Non-Medical): No  Physical Activity: Sufficiently Active (08/17/2022)   Exercise Vital Sign    Days of Exercise per Week: 4 days    Minutes of Exercise per Session: 60 min  Stress: Stress Concern Present (08/17/2022)   [employer] of Occupational Health - Occupational Stress Questionnaire    Feeling of Stress : Rather much  Social Connections: Moderately Isolated (08/17/2022)   Social Connection and Isolation Panel    Frequency of Communication with Friends and Family: Once a week    Frequency of Social Gatherings with Friends and Family: Never    Attends Religious Services: More than 4 times per year    Active Member of [employer] or Organizations: No    Attends Banker Meetings: Never    Marital Status: Married    Review of Systems Per HPI  Objective:  BP 133/85   Pulse ROLLEN)  119   Resp 18   Ht 5' 9 (1.753 m)   Wt 158 lb 1 oz (71.7 kg)   LMP 01/23/2024 (Approximate)   SpO2 98%   BMI 23.34 kg/m      02/06/2024   11:29 AM 09/07/2023   10:28 AM 08/31/2023    3:00 PM  BP/Weight  Systolic BP 133 126 156  Diastolic BP 85 80 99  Wt. (Lbs) 158.06 162   BMI 23.34 kg/m2 23.92 kg/m2     Physical Exam Vitals and nursing note reviewed.  Constitutional:      General: She is not in acute distress.    Appearance: Normal appearance.  HENT:     Head: Normocephalic and atraumatic.  Cardiovascular:     Rate and Rhythm: Normal rate and regular rhythm.  Pulmonary:     Effort: Pulmonary effort is normal.     Breath sounds: Normal breath sounds. No wheezing or rales.  Neurological:      Mental Status: She is alert.     Lab Results  Component Value Date   WBC 5.8 08/31/2023   HGB 13.8 08/31/2023   HCT 42.3 08/31/2023   PLT 286 08/31/2023   GLUCOSE 108 (H) 08/31/2023   CHOL 205 (H) 08/17/2022   TRIG 152 (H) 08/17/2022   HDL 77 08/17/2022   LDLCALC 102 (H) 08/17/2022   ALT 16 08/31/2023   AST 18 08/31/2023   NA 136 08/31/2023   K 4.3 08/31/2023   CL 100 08/31/2023   CREATININE 0.68 08/31/2023   BUN 11 08/31/2023   CO2 26 08/31/2023   TSH 0.569 08/17/2022     Assessment & Plan:  Vertigo Assessment & Plan: Meclizine  and Zofran  as directed. Work note given.  Orders: -     Meclizine  HCl; Take 1 tablet (25 mg total) by mouth 3 (three) times daily as needed for dizziness.  Dispense: 30 tablet; Refill: 0 -     Ondansetron ; Take 1 tablet (4 mg total) by mouth every 8 (eight) hours as needed for nausea or vomiting.  Dispense: 20 tablet; Refill: 0  Chronic bilateral low back pain without sciatica Assessment & Plan: Hydrocodone  sent in. I will not be prescribing long term pain medication.  Orders: -     HYDROcodone -Acetaminophen ; Take 1 tablet by mouth every 8 (eight) hours as needed for severe pain (pain score 7-10).  Dispense: 15 tablet; Refill: 0    Follow-up:  Return if symptoms worsen or fail to improve.  Jacqulyn Ahle DO [facility]

## 2024-02-07 NOTE — Assessment & Plan Note (Signed)
 Hydrocodone  sent in. I will not be prescribing long term pain medication.

## 2024-02-07 NOTE — Assessment & Plan Note (Signed)
 Meclizine  and Zofran  as directed. Work note given.

## 2024-04-23 ENCOUNTER — Ambulatory Visit: Admitting: Family Medicine

## 2024-05-13 ENCOUNTER — Other Ambulatory Visit: Payer: Self-pay | Admitting: Family Medicine

## 2024-05-13 DIAGNOSIS — F419 Anxiety disorder, unspecified: Secondary | ICD-10-CM

## 2024-05-13 DIAGNOSIS — G894 Chronic pain syndrome: Secondary | ICD-10-CM
